# Patient Record
Sex: Male | Born: 1975 | Hispanic: Yes | Marital: Married | Smoking: Light tobacco smoker
Health system: Southern US, Community
[De-identification: ages and names within clinical notes are randomized; demographics above are authoritative.]

## PROBLEM LIST (undated history)

## (undated) HISTORY — PX: APPENDECTOMY: SHX54

---

## 2015-11-03 ENCOUNTER — Emergency Department: Payer: Self-pay | Admitting: Anesthesiology

## 2015-11-03 ENCOUNTER — Emergency Department: Payer: Self-pay

## 2015-11-03 ENCOUNTER — Observation Stay
Admission: EM | Admit: 2015-11-03 | Discharge: 2015-11-04 | Disposition: A | Discharge status: Home / Self Care | Payer: Self-pay | Attending: Surgery | Admitting: Surgery

## 2015-11-03 ENCOUNTER — Encounter: Payer: Self-pay | Admitting: Emergency Medicine

## 2015-11-03 ENCOUNTER — Encounter
Admission: EM | Disposition: A | Discharge status: Home / Self Care | Payer: Self-pay | Source: Home / Self Care | Attending: Emergency Medicine

## 2015-11-03 DIAGNOSIS — K3580 Unspecified acute appendicitis: Principal | ICD-10-CM | POA: Diagnosis present

## 2015-11-03 DIAGNOSIS — K219 Gastro-esophageal reflux disease without esophagitis: Secondary | ICD-10-CM | POA: Insufficient documentation

## 2015-11-03 HISTORY — PX: LAPAROSCOPIC APPENDECTOMY: SHX408

## 2015-11-03 LAB — COMPREHENSIVE METABOLIC PANEL
ALBUMIN: 4.3 g/dL (ref 3.5–5.0)
ALT: 37 U/L (ref 17–63)
AST: 34 U/L (ref 15–41)
Alkaline Phosphatase: 126 U/L (ref 38–126)
Anion gap: 11 (ref 5–15)
BUN: 20 mg/dL (ref 6–20)
CHLORIDE: 102 mmol/L (ref 101–111)
CO2: 22 mmol/L (ref 22–32)
Calcium: 9.4 mg/dL (ref 8.9–10.3)
Creatinine, Ser: 0.77 mg/dL (ref 0.61–1.24)
GFR calc Af Amer: >60 mL/min (ref >60)
GFR calc non Af Amer: >60 mL/min (ref >60)
GLUCOSE: 152 mg/dL — AB (ref 65–99)
POTASSIUM: 3.7 mmol/L (ref 3.5–5.1)
Sodium: 135 mmol/L (ref 135–145)
Total Bilirubin: 0.3 mg/dL (ref 0.3–1.2)
Total Protein: 8.2 g/dL — ABNORMAL HIGH (ref 6.5–8.1)

## 2015-11-03 LAB — URINALYSIS COMPLETE WITH MICROSCOPIC (ARMC ONLY)
BILIRUBIN URINE: NEGATIVE
Bacteria, UA: NONE SEEN
Glucose, UA: NEGATIVE mg/dL
HGB URINE DIPSTICK: NEGATIVE
Nitrite: NEGATIVE
PH: 5 (ref 5.0–8.0)
PROTEIN: NEGATIVE mg/dL
Specific Gravity, Urine: 1.026 (ref 1.005–1.030)

## 2015-11-03 LAB — LIPASE, BLOOD: LIPASE: 40 U/L (ref 11–51)

## 2015-11-03 LAB — CBC
HCT: 43.9 % (ref 40.0–52.0)
HCT: 40.6 % (ref 40.0–52.0)
HEMOGLOBIN: 15 g/dL (ref 13.0–18.0)
Hemoglobin: 14 g/dL (ref 13.0–18.0)
MCH: 30.4 pg (ref 26.0–34.0)
MCH: 31 pg (ref 26.0–34.0)
MCHC: 34.2 g/dL (ref 32.0–36.0)
MCHC: 34.5 g/dL (ref 32.0–36.0)
MCV: 88.9 fL (ref 80.0–100.0)
MCV: 89.9 fL (ref 80.0–100.0)
PLATELETS: 344 10*3/uL (ref 150–440)
PLATELETS: 338 10*3/uL (ref 150–440)
RBC: 4.94 MIL/uL (ref 4.40–5.90)
RBC: 4.52 MIL/uL (ref 4.40–5.90)
RDW: 14.2 % (ref 11.5–14.5)
RDW: 14.3 % (ref 11.5–14.5)
WBC: 19.4 10*3/uL — AB (ref 3.8–10.6)
WBC: 22.2 10*3/uL — AB (ref 3.8–10.6)

## 2015-11-03 LAB — CREATININE, SERUM
CREATININE: 0.74 mg/dL (ref 0.61–1.24)
GFR calc non Af Amer: >60 mL/min (ref >60)

## 2015-11-03 SURGERY — APPENDECTOMY, LAPAROSCOPIC
Anesthesia: General

## 2015-11-03 MED ORDER — ONDANSETRON HCL 4 MG/2ML IJ SOLN
INTRAMUSCULAR | Status: DC | PRN
  Administered 2015-11-03: 4 mg via INTRAVENOUS

## 2015-11-03 MED ORDER — LACTATED RINGERS IV SOLN
INTRAVENOUS | Status: DC
  Administered 2015-11-03 – 2015-11-04 (×2): via INTRAVENOUS

## 2015-11-03 MED ORDER — FENTANYL CITRATE (PF) 100 MCG/2ML IJ SOLN
25.0000 ug | INTRAMUSCULAR | Status: AC | PRN
  Administered 2015-11-03 (×6): 25 ug via INTRAVENOUS

## 2015-11-03 MED ORDER — ONDANSETRON HCL 4 MG/2ML IJ SOLN: 4.0000 mg | Freq: Four times a day (QID) | INTRAMUSCULAR | Status: DC | PRN

## 2015-11-03 MED ORDER — IOPAMIDOL (ISOVUE-370) INJECTION 76%
100.0000 mL | Freq: Once | INTRAVENOUS | Status: AC | PRN
  Administered 2015-11-03: 100 mL via INTRAVENOUS

## 2015-11-03 MED ORDER — HYDROCODONE-ACETAMINOPHEN 5-325 MG PO TABS
1.0000 | ORAL_TABLET | ORAL | Status: DC | PRN
  Administered 2015-11-03 – 2015-11-04 (×3): 1 via ORAL
  Filled 2015-11-03 (×3): qty 1

## 2015-11-03 MED ORDER — FENTANYL CITRATE (PF) 100 MCG/2ML IJ SOLN
INTRAMUSCULAR | Status: DC | PRN
  Administered 2015-11-03 (×2): 50 ug via INTRAVENOUS
  Administered 2015-11-03: 100 ug via INTRAVENOUS

## 2015-11-03 MED ORDER — BUPIVACAINE-EPINEPHRINE (PF) 0.25% -1:200000 IJ SOLN
INTRAMUSCULAR | Status: DC | PRN
  Administered 2015-11-03: 30 mL via PERINEURAL

## 2015-11-03 MED ORDER — OXYCODONE-ACETAMINOPHEN 5-325 MG PO TABS
ORAL_TABLET | ORAL | Status: AC
  Filled 2015-11-03: qty 1

## 2015-11-03 MED ORDER — DEXAMETHASONE SODIUM PHOSPHATE 10 MG/ML IJ SOLN
INTRAMUSCULAR | Status: DC | PRN
  Administered 2015-11-03: 10 mg via INTRAVENOUS

## 2015-11-03 MED ORDER — CEFTRIAXONE SODIUM 1 G IJ SOLR
1.0000 g | Freq: Once | INTRAMUSCULAR | Status: AC
  Administered 2015-11-03: 1 g via INTRAVENOUS
  Filled 2015-11-03: qty 10

## 2015-11-03 MED ORDER — FENTANYL CITRATE (PF) 100 MCG/2ML IJ SOLN
INTRAMUSCULAR | Status: AC
  Administered 2015-11-03: 25 ug via INTRAVENOUS
  Filled 2015-11-03: qty 2

## 2015-11-03 MED ORDER — BUPIVACAINE-EPINEPHRINE (PF) 0.25% -1:200000 IJ SOLN
INTRAMUSCULAR | Status: AC
  Filled 2015-11-03: qty 30

## 2015-11-03 MED ORDER — ONDANSETRON HCL 4 MG/2ML IJ SOLN: 4.0000 mg | Freq: Once | INTRAMUSCULAR | Status: DC | PRN

## 2015-11-03 MED ORDER — MORPHINE SULFATE (PF) 2 MG/ML IV SOLN: 2.0000 mg | INTRAVENOUS | Status: DC | PRN

## 2015-11-03 MED ORDER — LIDOCAINE HCL (CARDIAC) 20 MG/ML IV SOLN
INTRAVENOUS | Status: DC | PRN
  Administered 2015-11-03: 30 mg via INTRAVENOUS

## 2015-11-03 MED ORDER — ONDANSETRON HCL 4 MG/2ML IJ SOLN
4.0000 mg | Freq: Once | INTRAMUSCULAR | Status: AC
  Administered 2015-11-03: 4 mg via INTRAVENOUS

## 2015-11-03 MED ORDER — ONDANSETRON HCL 4 MG/2ML IJ SOLN
INTRAMUSCULAR | Status: AC
  Administered 2015-11-03: 4 mg via INTRAVENOUS
  Filled 2015-11-03: qty 2

## 2015-11-03 MED ORDER — KETOROLAC TROMETHAMINE 30 MG/ML IJ SOLN
INTRAMUSCULAR | Status: DC | PRN
  Administered 2015-11-03: 30 mg via INTRAVENOUS

## 2015-11-03 MED ORDER — ONDANSETRON HCL 4 MG PO TABS: 4.0000 mg | ORAL_TABLET | Freq: Four times a day (QID) | ORAL | Status: DC | PRN

## 2015-11-03 MED ORDER — HEPARIN SODIUM (PORCINE) 5000 UNIT/ML IJ SOLN
5000.0000 [IU] | Freq: Three times a day (TID) | INTRAMUSCULAR | Status: DC
  Administered 2015-11-03 – 2015-11-04 (×2): 5000 [IU] via SUBCUTANEOUS
  Filled 2015-11-03 (×2): qty 1

## 2015-11-03 MED ORDER — SODIUM CHLORIDE 0.9 % IV SOLN
Freq: Once | INTRAVENOUS | Status: AC
Start: 2015-11-03 — End: 2015-11-03
  Administered 2015-11-03: 08:00:00 via INTRAVENOUS

## 2015-11-03 MED ORDER — OXYCODONE-ACETAMINOPHEN 5-325 MG PO TABS
1.0000 | ORAL_TABLET | Freq: Once | ORAL | Status: AC
Start: 2015-11-03 — End: 2015-11-03
  Administered 2015-11-03: 1 via ORAL

## 2015-11-03 MED ORDER — ROCURONIUM BROMIDE 100 MG/10ML IV SOLN
INTRAVENOUS | Status: DC | PRN
  Administered 2015-11-03: 5 mg via INTRAVENOUS
  Administered 2015-11-03: 35 mg via INTRAVENOUS

## 2015-11-03 MED ORDER — SUGAMMADEX SODIUM 500 MG/5ML IV SOLN
INTRAVENOUS | Status: DC | PRN
  Administered 2015-11-03: 208.6 mg via INTRAVENOUS

## 2015-11-03 MED ORDER — SUCCINYLCHOLINE CHLORIDE 20 MG/ML IJ SOLN
INTRAMUSCULAR | Status: DC | PRN
  Administered 2015-11-03: 120 mg via INTRAVENOUS

## 2015-11-03 MED ORDER — MORPHINE SULFATE (PF) 4 MG/ML IV SOLN
4.0000 mg | Freq: Once | INTRAVENOUS | Status: AC
  Administered 2015-11-03: 4 mg via INTRAVENOUS
  Filled 2015-11-03: qty 1

## 2015-11-03 MED ORDER — IOPAMIDOL (ISOVUE-300) INJECTION 61%
30.0000 mL | Freq: Once | INTRAVENOUS | Status: AC | PRN
  Administered 2015-11-03: 30 mL via ORAL

## 2015-11-03 MED ORDER — PROPOFOL 10 MG/ML IV BOLUS
INTRAVENOUS | Status: DC | PRN
  Administered 2015-11-03: 150 mg via INTRAVENOUS

## 2015-11-03 MED ORDER — SODIUM CHLORIDE 0.9 % IR SOLN
Status: DC | PRN
  Administered 2015-11-03: 800 mL

## 2015-11-03 SURGICAL SUPPLY — 45 items
ADHESIVE MASTISOL STRL (MISCELLANEOUS) ×2 IMPLANT
APPLIER CLIP ROT 10 11.4 M/L (STAPLE) ×2
BLADE CLIPPER SURG (BLADE) ×2 IMPLANT
BLADE SURG SZ11 CARB STEEL (BLADE) ×2 IMPLANT
CANISTER SUCT 3000ML (MISCELLANEOUS) ×2 IMPLANT
CATH TRAY 16F METER LATEX (MISCELLANEOUS) ×2 IMPLANT
CHLORAPREP W/TINT 26ML (MISCELLANEOUS) ×2 IMPLANT
CLIP APPLIE ROT 10 11.4 M/L (STAPLE) ×1 IMPLANT
CUTTER FLEX LINEAR 45M (STAPLE) ×2 IMPLANT
DECANTER SPIKE VIAL GLASS SM (MISCELLANEOUS) ×2 IMPLANT
DEVICE TROCAR PUNCTURE CLOSURE (ENDOMECHANICALS) ×2 IMPLANT
ELECT REM PT RETURN 9FT ADLT (ELECTROSURGICAL) ×2
ELECTRODE REM PT RTRN 9FT ADLT (ELECTROSURGICAL) ×1 IMPLANT
ENDOPOUCH RETRIEVER 10 (MISCELLANEOUS) ×2 IMPLANT
GAUZE SPONGE NON-WVN 2X2 STRL (MISCELLANEOUS) ×3 IMPLANT
GLOVE BIO SURGEON STRL SZ 6.5 (GLOVE) ×4 IMPLANT
GLOVE BIO SURGEON STRL SZ8 (GLOVE) ×2 IMPLANT
GLOVE BIOGEL PI IND STRL 7.0 (GLOVE) ×2 IMPLANT
GLOVE BIOGEL PI INDICATOR 7.0 (GLOVE) ×2
GOWN STRL REUS W/ TWL LRG LVL3 (GOWN DISPOSABLE) ×3 IMPLANT
GOWN STRL REUS W/TWL LRG LVL3 (GOWN DISPOSABLE) ×3
IRRIGATION STRYKERFLOW (MISCELLANEOUS) ×1 IMPLANT
IRRIGATOR STRYKERFLOW (MISCELLANEOUS) ×2
KIT RM TURNOVER STRD PROC AR (KITS) ×2 IMPLANT
LABEL OR SOLS (LABEL) ×2 IMPLANT
NDL SAFETY 22GX1.5 (NEEDLE) ×2 IMPLANT
NEEDLE VERESS 14GA 120MM (NEEDLE) ×2 IMPLANT
NS IRRIG 500ML POUR BTL (IV SOLUTION) ×2 IMPLANT
PACK LAP CHOLECYSTECTOMY (MISCELLANEOUS) ×2 IMPLANT
RELOAD 45 VASCULAR/THIN (ENDOMECHANICALS) ×4 IMPLANT
RELOAD STAPLE TA45 3.5 REG BLU (ENDOMECHANICALS) ×2 IMPLANT
SCISSORS METZENBAUM CVD 33 (INSTRUMENTS) IMPLANT
SLEEVE ENDOPATH XCEL 5M (ENDOMECHANICALS) ×2 IMPLANT
SOL .9 NS 3000ML IRR  AL (IV SOLUTION) ×1
SOL .9 NS 3000ML IRR UROMATIC (IV SOLUTION) ×1 IMPLANT
SPONGE LAP 18X18 5 PK (GAUZE/BANDAGES/DRESSINGS) ×2 IMPLANT
SPONGE VERSALON 2X2 STRL (MISCELLANEOUS) ×3
STRIP CLOSURE SKIN 1/2X4 (GAUZE/BANDAGES/DRESSINGS) ×2 IMPLANT
SUT MNCRL 4-0 (SUTURE) ×1
SUT MNCRL 4-0 27XMFL (SUTURE) ×1
SUT VICRYL 0 TIES 12 18 (SUTURE) ×2 IMPLANT
SUTURE MNCRL 4-0 27XMF (SUTURE) ×1 IMPLANT
TROCAR XCEL 12X100 BLDLESS (ENDOMECHANICALS) ×2 IMPLANT
TROCAR XCEL NON-BLD 5MMX100MML (ENDOMECHANICALS) ×2 IMPLANT
TUBING INSUFFLATOR HI FLOW (MISCELLANEOUS) ×2 IMPLANT

## 2015-11-03 NOTE — Transfer of Care (Signed)
Immediate Anesthesia Transfer of Care Note  Patient: Leonard Young  Procedure(s) Performed: Procedure(s): APPENDECTOMY LAPAROSCOPIC (N/A)  Patient Location: PACU  Anesthesia Type:General  Level of Consciousness: patient cooperative and lethargic  Airway & Oxygen Therapy: Patient Spontanous Breathing and Patient connected to face mask oxygen  Post-op Assessment: Report given to RN and Post -op Vital signs reviewed and stable  Post vital signs: Reviewed and stable  Last Vitals:  Vitals:   11/03/15 1352 11/03/15 1557  BP: 138/84 140/85  Pulse: 80 93  Resp: 18 19  Temp: 36.7 C 37.4 C    Last Pain:  Vitals:   11/03/15 1557  TempSrc:   PainSc: Asleep         Complications: No apparent anesthesia complications

## 2015-11-03 NOTE — ED Provider Notes (Signed)
[facility] Emergency Department Provider Note        Time seen: ----------------------------------------- 7:32 AM on 11/03/2015 -----------------------------------------    I have reviewed the triage vital signs and the nursing notes.   HISTORY  Chief Complaint Abdominal Pain    HPI Leonard Young is a 40 y.o. male who presents to ER for abdominal pain. Patient states she's had nausea and vomiting has vomited 3 times since the pain began around midnight. Patient reports she's had a history of this before, he was given prescription for pain but nothing else. He is uncertain of the etiology. Patient states pain started after eating. He denies fever or diarrhea.   History reviewed. No pertinent past medical history.  There are no active problems to display for this patient.   History reviewed. No pertinent surgical history.  Allergies Review of patient's allergies indicates no known allergies.  Social History Social History  Substance Use Topics  . Smoking status: Never Smoker  . Smokeless tobacco: Never Used  . Alcohol use 6.0 oz/week    10 Cans of beer per week    Review of Systems Constitutional: Negative for fever. Cardiovascular: Negative for chest pain. Respiratory: Negative for shortness of breath. Gastrointestinal: Positive for abdominal pain, vomiting Genitourinary: Negative for dysuria. Musculoskeletal: Negative for back pain. Skin: Negative for rash. Neurological: Negative for headaches, focal weakness or numbness.  10-point ROS otherwise negative.  ____________________________________________   PHYSICAL EXAM:  VITAL SIGNS: ED Triage Vitals  Enc Vitals Group     BP 11/03/15 0609 (!) 142/76     Pulse Rate 11/03/15 0609 67     Resp 11/03/15 0609 18     Temp 11/03/15 0609 98.8 F (37.1 C)     Temp Source 11/03/15 0609 Oral     SpO2 11/03/15 0609 97 %     Weight 11/03/15 0609 230 lb (104.3 kg)     Height 11/03/15  0609 5\' 6"  (1.676 m)     Head Circumference --      Peak Flow --      Pain Score 11/03/15 0621 9     Pain Loc --      Pain Edu? --      Excl. in GC? --     Constitutional: Alert and oriented. Well appearing and in no distress. Eyes: Conjunctivae are normal. PERRL. Normal extraocular movements. ENT   Head: Normocephalic and atraumatic.   Nose: No congestion/rhinnorhea.   Mouth/Throat: Mucous membranes are moist.   Neck: No stridor. Cardiovascular: Normal rate, regular rhythm. No murmurs, rubs, or gallops. Respiratory: Normal respiratory effort without tachypnea nor retractions. Breath sounds are clear and equal bilaterally. No wheezes/rales/rhonchi. Gastrointestinal: Nonfocal abdominal tenderness, no rebound or guarding. Normal bowel sounds. Musculoskeletal: Nontender with normal range of motion in all extremities. No lower extremity tenderness nor edema. Neurologic:  Normal speech and language. No gross focal neurologic deficits are appreciated.  Skin:  Skin is warm, dry and intact. No rash noted. Psychiatric: Mood and affect are normal. Speech and behavior are normal.  ____________________________________________  ED COURSE:  Pertinent labs & imaging results that were available during my care of the patient were reviewed by me and considered in my medical decision making (see chart for details). Clinical Course  Patient is no distress, I will assess with basic labs, likely imaging for leukocytosis.  Procedures ____________________________________________   LABS (pertinent positives/negatives)  Labs Reviewed  COMPREHENSIVE METABOLIC PANEL - Abnormal; Notable for the following:  Result Value   Glucose, Bld 152 (*)    Total Protein 8.2 (*)    All other components within normal limits  CBC - Abnormal; Notable for the following:    WBC 19.4 (*)    All other components within normal limits  URINALYSIS COMPLETEWITH MICROSCOPIC ([facility] ONLY) - Abnormal; Notable  for the following:    Color, Urine YELLOW (*)    APPearance CLEAR (*)    Ketones, ur TRACE (*)    Leukocytes, UA TRACE (*)    Squamous Epithelial / LPF 0-5 (*)    All other components within normal limits  LIPASE, BLOOD    RADIOLOGY Images were viewed by me  CT the abdomen and pelvis with contrast Reveals appendicitis ____________________________________________  FINAL ASSESSMENT AND PLAN  Appendicitis  Plan: Patient with labs and imaging as dictated above. CT findings confirm appendicitis. I have ordered IV antibiotics for him. I discussed with Dr. Excell Seltzer from radiology will come evaluate the patient in the ER.   Emily Filbert, MD   Note: This dictation was prepared with Dragon dictation. Any transcriptional errors that result from this process are unintentional    Emily Filbert, MD 11/03/15 1020

## 2015-11-03 NOTE — ED Triage Notes (Signed)
Pt c/o mid abdominal pain that is on the navel region. Pt states that he has N/V and has vomited x3 since pain began around midnight. Pain started after eating. Pt is alert and oriented with NAD noted at this time.

## 2015-11-03 NOTE — Op Note (Signed)
laparascopic appendectomy   Carole Binningafael Vera Garcia Date of operation:  11/03/2015  Indications: The patient presented with a history of  abdominal pain. Workup has revealed findings consistent with acute appendicitis.  Pre-operative Diagnosis: Acute appendicitis  Post-operative Diagnosis: Acute suppurative appendicitis nonruptured  Surgeon: Adah Salvageichard E. Excell Seltzerooper, MD, FACS  Anesthesia: General with endotracheal tube  Procedure Details  The patient was seen again in the preop area. The options of surgery versus observation were reviewed with the patient and/or family. The risks of bleeding, infection, recurrence of symptoms, negative laparoscopy, potential for an open procedure, bowel injury, abscess or infection, were all reviewed as well. The patient was taken to Operating Room, identified as Carole BinningRafael Vera Garcia and the procedure verified as laparoscopic appendectomy. A Time Out was held and the above information confirmed.  The patient was placed in the supine position and general anesthesia was induced.  Antibiotic prophylaxis was administered and VT E prophylaxis was in place. A Foley catheter was placed by the nursing staff.   The abdomen was prepped and draped in a sterile fashion. An infraumbilical incision was made. A Veress needle was placed and pneumoperitoneum was obtained. A 5 mm trocar port was placed without difficulty and the abdominal cavity was explored.  Under direct vision a 5 mm suprapubic port was placed and a 13 mm left lateral port was placed all under direct vision.  The appendix was identified and found to be acutely inflamed it was in the retrocecal position  The appendix was carefully dissected. The base of the appendix was dissected out and divided with a standard load Endo GIA. The mesoappendix was divided with a vascular load Endo GIA. 2 loads were required The appendix was passed out through the left lateral port site with the aid of an Endo Catch bag. The right lower  quadrant and pelvis was then irrigated with copious amounts of normal saline which was aspirated. Inspection  failed to identify any additional bleeding and there were no signs of bowel injury. Therefore the left lateral port site was closed under direct vision utilizing an Endo Close technique with 0 Vicryl interrupted sutures, all under direct vision.   Again the right lower quadrant was inspected there was no sign of bleeding or bowel injury therefore pneumoperitoneum was released, all ports were removed and the skin incisions were approximated with subcuticular 4-0 Monocryl. Steri-Strips and Mastisol and sterile dressings were placed.  The patient tolerated the procedure well, there were no complications. The sponge lap and needle count were correct at the end of the procedure.  The patient was taken to the recovery room in stable condition to be admitted for continued care.  Findings: Acute appendicitis nonruptured in a retrocecal position  Estimated Blood Loss: 25 cc                  Specimens: appendix         Complications: None                  Demitrius Crass E. Excell Seltzerooper MD, FACS

## 2015-11-03 NOTE — ED Notes (Signed)
Pt finished CT contrast and Toni in CT notified.

## 2015-11-03 NOTE — ED Notes (Signed)
Pt states that he just came from [place] Friday.

## 2015-11-03 NOTE — H&P (Signed)
Leonard Young is an 40 y.o. male.    Chief Complaint: Right lower quadrant pain  HPI: This patient with one day of abdominal pain he states that it is mostly central but is in the right lower quadrant as well. He is Spanish-speaking and Dr. Hampton Abbot has assisted in obtaining the answers to my questions. A Spanish speaking interpreter will be obtained when he comes to the operating room see below. Patient has had nausea and vomiting normal bowel movement yesterday no fevers or chills he's never had an episode like this before  He has no past medical history no past surgical history he works in [employer] smokes tobacco once in a while does not drink much alcohol no family history  History reviewed. No pertinent past medical history.  History reviewed. No pertinent surgical history.  No family history on file. Social History:  reports that he has never smoked. He has never used smokeless tobacco. He reports that he drinks about 6.0 oz of alcohol per week . He reports that he does not use drugs.  Allergies: No Known Allergies   (Not in a hospital admission)   Review of Systems  Constitutional: Negative for chills and fever.  HENT: Negative.   Eyes: Negative.   Respiratory: Negative.   Cardiovascular: Negative.   Gastrointestinal: Positive for abdominal pain, heartburn, nausea and vomiting. Negative for blood in stool, constipation, diarrhea and melena.  Genitourinary: Negative.   Musculoskeletal: Negative.   Skin: Negative.   Neurological: Negative.   Endo/Heme/Allergies: Negative.   Psychiatric/Behavioral: Negative.      Physical Exam:  BP (!) 141/90   Pulse 84   Temp 98.2 F (36.8 C) (Oral)   Resp 18   Ht _0  (1.676 m)   Wt 230 lb (104.3 kg)   SpO2 100%   BMI 37.12 kg/m   Physical Exam  Constitutional: He is oriented to person, place, and time and well-developed, well-nourished, and in no distress. No distress.  HENT:  Head: Normocephalic and atraumatic.   Eyes: Pupils are equal, round, and reactive to light. Right eye exhibits no discharge. Left eye exhibits no discharge. No scleral icterus.  Neck: Normal range of motion.  Cardiovascular: Normal rate, regular rhythm and normal heart sounds.   Pulmonary/Chest: Effort normal and breath sounds normal. No respiratory distress. He has no wheezes. He has no rales.  Abdominal: Soft. He exhibits no distension. There is tenderness. There is rebound and guarding.  Musculoskeletal: Normal range of motion. He exhibits no edema or tenderness.  Lymphadenopathy:    He has no cervical adenopathy.  Neurological: He is alert and oriented to person, place, and time.  Skin: Skin is warm and dry. No rash noted. He is not diaphoretic. No erythema.  Psychiatric: Mood and affect normal.  Vitals reviewed.       Results for orders placed or performed during the hospital encounter of 11/03/15 (from the past 48 hour(s))  Lipase, blood     Status: None   Collection Time: 11/03/15  6:26 AM  Result Value Ref Range   Lipase 40 11 - 51 U/L  Comprehensive metabolic panel     Status: Abnormal   Collection Time: 11/03/15  6:26 AM  Result Value Ref Range   Sodium 135 135 - 145 mmol/L   Potassium 3.7 3.5 - 5.1 mmol/L   Chloride 102 101 - 111 mmol/L   CO2 22 22 - 32 mmol/L   Glucose, Bld 152 (H) 65 - 99 mg/dL   BUN 20  6 - 20 mg/dL   Creatinine, Ser 0.77 0.61 - 1.24 mg/dL   Calcium 9.4 8.9 - 10.3 mg/dL   Total Protein 8.2 (H) 6.5 - 8.1 g/dL   Albumin 4.3 3.5 - 5.0 g/dL   AST 34 15 - 41 U/L   ALT 37 17 - 63 U/L   Alkaline Phosphatase 126 38 - 126 U/L   Total Bilirubin 0.3 0.3 - 1.2 mg/dL   GFR calc non Af Amer >60 >60 mL/min   GFR calc Af Amer >60 >60 mL/min    Comment: (NOTE) The eGFR has been calculated using the CKD EPI equation. This calculation has not been validated in all clinical situations. eGFR's persistently <60 mL/min signify possible Chronic Kidney Disease.    Anion gap 11 5 - 15  CBC      Status: Abnormal   Collection Time: 11/03/15  6:26 AM  Result Value Ref Range   WBC 19.4 (H) 3.8 - 10.6 K/uL   RBC 4.94 4.40 - 5.90 MIL/uL   Hemoglobin 15.0 13.0 - 18.0 g/dL   HCT 43.9 40.0 - 52.0 %   MCV 88.9 80.0 - 100.0 fL   MCH 30.4 26.0 - 34.0 pg   MCHC 34.2 32.0 - 36.0 g/dL   RDW 14.2 11.5 - 14.5 %   Platelets 344 150 - 440 K/uL  Urinalysis complete, with microscopic     Status: Abnormal   Collection Time: 11/03/15  6:26 AM  Result Value Ref Range   Color, Urine YELLOW (A) YELLOW   APPearance CLEAR (A) CLEAR   Glucose, UA NEGATIVE NEGATIVE mg/dL   Bilirubin Urine NEGATIVE NEGATIVE   Ketones, ur TRACE (A) NEGATIVE mg/dL   Specific Gravity, Urine 1.026 1.005 - 1.030   Hgb urine dipstick NEGATIVE NEGATIVE   pH 5.0 5.0 - 8.0   Protein, ur NEGATIVE NEGATIVE mg/dL   Nitrite NEGATIVE NEGATIVE   Leukocytes, UA TRACE (A) NEGATIVE   RBC / HPF 0-5 0 - 5 RBC/hpf   WBC, UA 0-5 0 - 5 WBC/hpf   Bacteria, UA NONE SEEN NONE SEEN   Squamous Epithelial / LPF 0-5 (A) NONE SEEN   Mucous PRESENT    Ct Abdomen Pelvis W Contrast  Result Date: 11/03/2015 CLINICAL DATA:  Diffuse abdominal pain.  Nausea and vomiting. EXAM: CT ABDOMEN AND PELVIS WITH CONTRAST TECHNIQUE: Multidetector CT imaging of the abdomen and pelvis was performed using the standard protocol following bolus administration of intravenous contrast. CONTRAST:  100 cc Isovue 370 IV. COMPARISON:  None. FINDINGS: Lower chest: Subpleural 0.4 cm right lower lobe solid pulmonary nodule (series 4/image 12). Hepatobiliary: Normal liver size. Hypodense 0.6 cm left liver lobe lesion, too small to characterize, for which no further follow-up is required. No additional liver lesions. Normal gallbladder with no radiopaque cholelithiasis. No biliary ductal dilatation. Pancreas: Normal, with no mass or duct dilation. Spleen: Normal size. No mass. Adrenals/Urinary Tract: Normal adrenals. Normal kidneys with no hydronephrosis and no renal mass. Normal  bladder. Stomach/Bowel: Grossly normal stomach. Normal caliber small bowel with no small bowel wall thickening. Dilated appendix (17 mm diameter). Diffuse appendiceal wall thickening and hyperenhancement and periappendiceal fat stranding, consistent with acute appendicitis. Normal large bowel with no diverticulosis, large bowel wall thickening or pericolonic fat stranding. Vascular/Lymphatic: Normal caliber abdominal aorta. Patent portal, splenic, hepatic and renal veins. No pathologically enlarged lymph nodes in the abdomen or pelvis. Reproductive: Normal size prostate with nonspecific coarse internal prostatic calcifications. Other: No pneumoperitoneum, ascites or focal fluid collection. Musculoskeletal: No  aggressive appearing focal osseous lesions. Mild thoracolumbar spondylosis . IMPRESSION: Acute appendicitis.  No perforation or abscess. These results were called by telephone at the time of interpretation on 11/03/2015 at 10:17 am to Dr. Lenise Arena , who verbally acknowledged these results. Electronically Signed   By: Ilona Sorrel M.D.   On: 11/03/2015 10:19     Assessment/Plan  This patient with acute appendicitis I see no sign of rupture at this point but he requires laparoscopic appendectomy. I discussed with him with Dr. Hampton Abbot the need for laparoscopic appendectomy and I discussed the risks of bleeding infection recurrence negative laparoscopy and an open procedure this is all reviewed with him and will be reviewed again with a qualified hospital provided interpreter when available.  Florene Glen, MD, FACS

## 2015-11-03 NOTE — Anesthesia Postprocedure Evaluation (Signed)
Anesthesia Post Note  Patient: Leonard BinningRafael Vera Young  Procedure(s) Performed: Procedure(s) (LRB): APPENDECTOMY LAPAROSCOPIC (N/A)  Patient location during evaluation: PACU Anesthesia Type: General Level of consciousness: awake and alert and oriented Pain management: pain level controlled Vital Signs Assessment: post-procedure vital signs reviewed and stable Respiratory status: spontaneous breathing, nonlabored ventilation and respiratory function stable Cardiovascular status: blood pressure returned to baseline and stable Postop Assessment: no signs of nausea or vomiting Anesthetic complications: no    Last Vitals:  Vitals:   11/03/15 1612 11/03/15 1627  BP: (!) 148/84 (!) 149/87  Pulse: 97 92  Resp: (!) 21 15  Temp:      Last Pain:  Vitals:   11/03/15 1627  TempSrc:   PainSc: 5                  Danene Montijo

## 2015-11-03 NOTE — Anesthesia Procedure Notes (Signed)
Procedure Name: Intubation Date/Time: 11/03/2015 2:47 PM Performed by: Omer JackWEATHERLY, Keron Neenan Pre-anesthesia Checklist: Patient identified, Patient being monitored, Timeout performed, Emergency Drugs available and Suction available Patient Re-evaluated:Patient Re-evaluated prior to inductionOxygen Delivery Method: Circle system utilized Preoxygenation: Pre-oxygenation with 100% oxygen Intubation Type: IV induction Ventilation: Oral airway inserted - appropriate to patient size and Mask ventilation with difficulty Laryngoscope Size: Mac and 3 Grade View: Grade II Tube type: Oral Tube size: 7.5 mm Number of attempts: 1 Placement Confirmation: ETT inserted through vocal cords under direct vision,  positive ETCO2 and breath sounds checked- equal and bilateral Secured at: 21 cm Tube secured with: Tape Dental Injury: Teeth and Oropharynx as per pre-operative assessment

## 2015-11-03 NOTE — Anesthesia Preprocedure Evaluation (Signed)
Anesthesia Evaluation  Patient identified by MRN, date of birth, ID band Patient awake    Reviewed: Allergy & Precautions, NPO status , Patient's Chart, lab work & pertinent test results  History of Anesthesia Complications Negative for: history of anesthetic complications  Airway Mallampati: III       Dental   Pulmonary neg pulmonary ROS,           Cardiovascular negative cardio ROS       Neuro/Psych negative neurological ROS  negative psych ROS   GI/Hepatic Neg liver ROS, GERD  ,  Endo/Other  negative endocrine ROS  Renal/GU negative Renal ROS     Musculoskeletal   Abdominal   Peds  Hematology negative hematology ROS (+)   Anesthesia Other Findings   Reproductive/Obstetrics                             Anesthesia Physical Anesthesia Plan  ASA: II and emergent  Anesthesia Plan: General   Post-op Pain Management:    Induction: Intravenous  Airway Management Planned: Oral ETT  Additional Equipment:   Intra-op Plan:   Post-operative Plan:   Informed Consent: I have reviewed the patients History and Physical, chart, labs and discussed the procedure including the risks, benefits and alternatives for the proposed anesthesia with the patient or authorized representative who has indicated his/her understanding and acceptance.     Plan Discussed with:   Anesthesia Plan Comments:         Anesthesia Quick Evaluation

## 2015-11-03 NOTE — ED Notes (Signed)
Pt started PO contrast.

## 2015-11-04 ENCOUNTER — Encounter: Payer: Self-pay | Admitting: Surgery

## 2015-11-04 LAB — CBC
HCT: 34.3 % — ABNORMAL LOW (ref 40.0–52.0)
Hemoglobin: 12 g/dL — ABNORMAL LOW (ref 13.0–18.0)
MCH: 31.5 pg (ref 26.0–34.0)
MCHC: 34.9 g/dL (ref 32.0–36.0)
MCV: 90.2 fL (ref 80.0–100.0)
PLATELETS: 338 10*3/uL (ref 150–440)
RBC: 3.81 MIL/uL — ABNORMAL LOW (ref 4.40–5.90)
RDW: 14.3 % (ref 11.5–14.5)
WBC: 19.2 10*3/uL — AB (ref 3.8–10.6)

## 2015-11-04 MED ORDER — HYDROCODONE-ACETAMINOPHEN 5-325 MG PO TABS: 1.0000 | ORAL_TABLET | ORAL | 0 refills | Status: DC | PRN

## 2015-11-04 NOTE — Progress Notes (Signed)
Patient feels well tolerated diet and wants to be discharged.  We'll discharge today on Vicodin when necessary and follow-up next week

## 2015-11-04 NOTE — Discharge Summary (Signed)
Physician Discharge Summary  Patient ID: Leonard BinningRafael Vera Young MRN: [id number] DOB/AGE: 41/06/1975 41 y.o.  Admit date: 11/03/2015 Discharge date: 11/04/2015   Discharge Diagnoses:  Active Problems:   Acute appendicitis   Procedures:Laparoscopic appendectomy  Hospital Course this patient admitted the hospital with a diagnosis of acute appendicitis he was taken the operating room where laparoscopy confirmed that diagnosis and appendectomy was performed he made a non-, K to postoperative recovery was discharged stable condition to follow-up in our office in 10 days he is given Vicodin for pain and instructions to shower.  Consults: None  Disposition: Final discharge disposition not confirmed     Medication List    TAKE these medications   acetaminophen 325 MG tablet Commonly known as:  TYLENOL Take 325 mg by mouth every 6 (six) hours as needed for moderate pain, fever or headache.   HYDROcodone-acetaminophen 5-325 MG tablet Commonly known as:  NORCO/VICODIN Take 1-2 tablets by mouth every 4 (four) hours as needed for moderate pain.      Follow-up Information    Dionne Miloichard Cooper, MD Follow up in 2 week(s).   Specialty:  Surgery Contact information: [address] [place] [id number] [phone]           Lattie Hawichard E Cooper, MD, FACS

## 2015-11-04 NOTE — Progress Notes (Signed)
1 Day Post-Op  Subjective: Status post laparoscopic appendectomy. Patient feels well this morning will advance diet  Objective: Vital signs in last 24 hours: Temp:  [98 F (36.7 C)-99.4 F (37.4 C)] 98.2 F (36.8 C) (09/12 0446) Pulse Rate:  [80-107] 90 (09/12 0446) Resp:  [15-21] 20 (09/12 0446) BP: (111-149)/(62-90) 123/71 (09/12 0446) SpO2:  [93 %-100 %] 96 % (09/12 0446) Last BM Date: 11/02/15  Intake/Output from previous day: 09/11 0701 - 09/12 0700 In: 1943.4 [I.V.:1943.4] Out: 640 [Urine:600; Blood:40] Intake/Output this shift: No intake/output data recorded.  Physical exam:  Abdomen is soft nontender wounds are clean and dressed  Lab Results: CBC   Recent Labs  11/03/15 1752 11/04/15 0422  WBC 22.2* 19.2*  HGB 14.0 12.0*  HCT 40.6 34.3*  PLT 338 338   BMET  Recent Labs  11/03/15 0626 11/03/15 1752  NA 135  --   K 3.7  --   CL 102  --   CO2 22  --   GLUCOSE 152*  --   BUN 20  --   CREATININE 0.77 0.74  CALCIUM 9.4  --    PT/INR No results for input(s): LABPROT, INR in the last 72 hours. ABG No results for input(s): PHART, HCO3 in the last 72 hours.  Invalid input(s): PCO2, PO2  Studies/Results: Ct Abdomen Pelvis W Contrast  Result Date: 11/03/2015 CLINICAL DATA:  Diffuse abdominal pain.  Nausea and vomiting. EXAM: CT ABDOMEN AND PELVIS WITH CONTRAST TECHNIQUE: Multidetector CT imaging of the abdomen and pelvis was performed using the standard protocol following bolus administration of intravenous contrast. CONTRAST:  100 cc Isovue 370 IV. COMPARISON:  None. FINDINGS: Lower chest: Subpleural 0.4 cm right lower lobe solid pulmonary nodule (series 4/image 12). Hepatobiliary: Normal liver size. Hypodense 0.6 cm left liver lobe lesion, too small to characterize, for which no further follow-up is required. No additional liver lesions. Normal gallbladder with no radiopaque cholelithiasis. No biliary ductal dilatation. Pancreas: Normal, with no mass or  duct dilation. Spleen: Normal size. No mass. Adrenals/Urinary Tract: Normal adrenals. Normal kidneys with no hydronephrosis and no renal mass. Normal bladder. Stomach/Bowel: Grossly normal stomach. Normal caliber small bowel with no small bowel wall thickening. Dilated appendix (17 mm diameter). Diffuse appendiceal wall thickening and hyperenhancement and periappendiceal fat stranding, consistent with acute appendicitis. Normal large bowel with no diverticulosis, large bowel wall thickening or pericolonic fat stranding. Vascular/Lymphatic: Normal caliber abdominal aorta. Patent portal, splenic, hepatic and renal veins. No pathologically enlarged lymph nodes in the abdomen or pelvis. Reproductive: Normal size prostate with nonspecific coarse internal prostatic calcifications. Other: No pneumoperitoneum, ascites or focal fluid collection. Musculoskeletal: No aggressive appearing focal osseous lesions. Mild thoracolumbar spondylosis . IMPRESSION: Acute appendicitis.  No perforation or abscess. These results were called by telephone at the time of interpretation on 11/03/2015 at 10:17 am to Dr. Daryel NovemberJONATHAN WILLIAMS , who verbally acknowledged these results. Electronically Signed   By: Delbert PhenixJason A Poff M.D.   On: 11/03/2015 10:19    Anti-infectives: Anti-infectives    Start     Dose/Rate Route Frequency Ordered Stop   11/03/15 1030  cefTRIAXone (ROCEPHIN) 1 g in dextrose 5 % 50 mL IVPB     1 g 100 mL/hr over 30 Minutes Intravenous  Once 11/03/15 1019 11/03/15 1142      Assessment/Plan: s/p Procedure(s): APPENDECTOMY LAPAROSCOPIC   Patient doing very well after laparoscopic appendectomy will likely be able to go home later on this morning will advance diet  Lattie Hawichard E Marquette Blodgett, MD,  FACS  11/04/2015

## 2015-11-04 NOTE — Progress Notes (Signed)
Patient discharged to home. Interpreter used for communication. IV site removed. Prescriptions given.

## 2015-11-04 NOTE — Discharge Instructions (Signed)
Remove dressing in 24 hours. °May shower in 24 hours. °Leave paper strips in place. °Resume all home medications. °Follow-up with Dr. Kenyana Husak in 10 days. °

## 2015-11-05 LAB — SURGICAL PATHOLOGY

## 2015-11-10 ENCOUNTER — Telehealth: Payer: Self-pay

## 2015-11-10 NOTE — Telephone Encounter (Signed)
Patient called stating that he had a Laparoscopic Appendectomy surgery done on 11/03/2015 by Dr. Excell Seltzerooper. He stated that overall he has been feeling better but for the past few days he has been with an extended abdomen, nausea, vomiting and diarrhea. I asked if he has had a fever or drainage from his incision and he stated that he had not. He stated that he had a post-operation appointment until 11/18/2015 with Dr. Tonita CongWoodham but wouldn't be able to wait to be seen until then. He requested to be seen sooner so I changed his appointment to tomorrow 11/11/2015 to be seen by Dr. Orvis BrillLoflin. Patient agreed.

## 2015-11-11 ENCOUNTER — Observation Stay
Admission: AD | Admit: 2015-11-11 | Discharge: 2015-11-13 | Disposition: A | Discharge status: Home / Self Care | Payer: Self-pay | Source: Ambulatory Visit | Attending: Surgery | Admitting: Surgery

## 2015-11-11 ENCOUNTER — Observation Stay: Payer: Self-pay

## 2015-11-11 ENCOUNTER — Encounter: Payer: Self-pay | Admitting: Surgery

## 2015-11-11 ENCOUNTER — Ambulatory Visit (INDEPENDENT_AMBULATORY_CARE_PROVIDER_SITE_OTHER): Payer: Self-pay | Admitting: Surgery

## 2015-11-11 VITALS — BP 125/58 | HR 106 | Temp 101.1°F | Ht 67.0 in | Wt 229.0 lb

## 2015-11-11 DIAGNOSIS — B9620 Unspecified Escherichia coli [E. coli] as the cause of diseases classified elsewhere: Secondary | ICD-10-CM | POA: Insufficient documentation

## 2015-11-11 DIAGNOSIS — K352 Acute appendicitis with generalized peritonitis, without abscess: Secondary | ICD-10-CM

## 2015-11-11 DIAGNOSIS — B954 Other streptococcus as the cause of diseases classified elsewhere: Secondary | ICD-10-CM | POA: Insufficient documentation

## 2015-11-11 DIAGNOSIS — Z1639 Resistance to other specified antimicrobial drug: Secondary | ICD-10-CM | POA: Insufficient documentation

## 2015-11-11 DIAGNOSIS — Y838 Other surgical procedures as the cause of abnormal reaction of the patient, or of later complication, without mention of misadventure at the time of the procedure: Secondary | ICD-10-CM | POA: Insufficient documentation

## 2015-11-11 DIAGNOSIS — IMO0002 Reserved for concepts with insufficient information to code with codable children: Secondary | ICD-10-CM

## 2015-11-11 DIAGNOSIS — R1031 Right lower quadrant pain: Secondary | ICD-10-CM | POA: Diagnosis present

## 2015-11-11 DIAGNOSIS — B999 Unspecified infectious disease: Secondary | ICD-10-CM | POA: Diagnosis present

## 2015-11-11 DIAGNOSIS — Z79899 Other long term (current) drug therapy: Secondary | ICD-10-CM | POA: Insufficient documentation

## 2015-11-11 DIAGNOSIS — L7632 Postprocedural hematoma of skin and subcutaneous tissue following other procedure: Principal | ICD-10-CM | POA: Insufficient documentation

## 2015-11-11 DIAGNOSIS — L0291 Cutaneous abscess, unspecified: Secondary | ICD-10-CM

## 2015-11-11 DIAGNOSIS — T814XXA Infection following a procedure, initial encounter: Secondary | ICD-10-CM | POA: Insufficient documentation

## 2015-11-11 DIAGNOSIS — Z1611 Resistance to penicillins: Secondary | ICD-10-CM | POA: Insufficient documentation

## 2015-11-11 LAB — COMPREHENSIVE METABOLIC PANEL
ALBUMIN: 2.6 g/dL — AB (ref 3.5–5.0)
ALT: 36 U/L (ref 17–63)
AST: 47 U/L — AB (ref 15–41)
Alkaline Phosphatase: 198 U/L — ABNORMAL HIGH (ref 38–126)
Anion gap: 9 (ref 5–15)
BILIRUBIN TOTAL: 2.3 mg/dL — AB (ref 0.3–1.2)
BUN: 13 mg/dL (ref 6–20)
CHLORIDE: 94 mmol/L — AB (ref 101–111)
CO2: 27 mmol/L (ref 22–32)
CREATININE: 0.72 mg/dL (ref 0.61–1.24)
Calcium: 8.1 mg/dL — ABNORMAL LOW (ref 8.9–10.3)
GFR calc Af Amer: >60 mL/min (ref >60)
GLUCOSE: 125 mg/dL — AB (ref 65–99)
POTASSIUM: 2.9 mmol/L — AB (ref 3.5–5.1)
Sodium: 130 mmol/L — ABNORMAL LOW (ref 135–145)
TOTAL PROTEIN: 6.8 g/dL (ref 6.5–8.1)

## 2015-11-11 LAB — CBC WITH DIFFERENTIAL/PLATELET
BASOS ABS: 0.1 10*3/uL (ref 0–0.1)
BASOS PCT: 0 %
Eosinophils Absolute: 0 10*3/uL (ref 0–0.7)
Eosinophils Relative: 0 %
HEMATOCRIT: 27.7 % — AB (ref 40.0–52.0)
Hemoglobin: 9.3 g/dL — ABNORMAL LOW (ref 13.0–18.0)
LYMPHS PCT: 4 %
Lymphs Abs: 1.1 10*3/uL (ref 1.0–3.6)
MCH: 29.9 pg (ref 26.0–34.0)
MCHC: 33.5 g/dL (ref 32.0–36.0)
MCV: 89.3 fL (ref 80.0–100.0)
Monocytes Absolute: 1.7 10*3/uL — ABNORMAL HIGH (ref 0.2–1.0)
Monocytes Relative: 6 %
NEUTROS ABS: 25.4 10*3/uL — AB (ref 1.4–6.5)
NEUTROS PCT: 90 %
Platelets: 428 10*3/uL (ref 150–440)
RBC: 3.1 MIL/uL — AB (ref 4.40–5.90)
RDW: 14 % (ref 11.5–14.5)
WBC: 28.3 10*3/uL — AB (ref 3.8–10.6)

## 2015-11-11 MED ORDER — ONDANSETRON 4 MG PO TBDP: 4.0000 mg | ORAL_TABLET | Freq: Four times a day (QID) | ORAL | Status: DC | PRN

## 2015-11-11 MED ORDER — IOPAMIDOL (ISOVUE-300) INJECTION 61%
100.0000 mL | Freq: Once | INTRAVENOUS | Status: AC | PRN
  Administered 2015-11-11: 100 mL via INTRAVENOUS

## 2015-11-11 MED ORDER — KCL IN DEXTROSE-NACL 20-5-0.45 MEQ/L-%-% IV SOLN
INTRAVENOUS | Status: DC
  Administered 2015-11-11 – 2015-11-13 (×3): via INTRAVENOUS
  Filled 2015-11-11 (×6): qty 1000

## 2015-11-11 MED ORDER — DIPHENHYDRAMINE HCL 12.5 MG/5ML PO ELIX: 12.5000 mg | ORAL_SOLUTION | Freq: Four times a day (QID) | ORAL | Status: DC | PRN

## 2015-11-11 MED ORDER — ONDANSETRON HCL 4 MG/2ML IJ SOLN: 4.0000 mg | Freq: Four times a day (QID) | INTRAMUSCULAR | Status: DC | PRN

## 2015-11-11 MED ORDER — ENOXAPARIN SODIUM 40 MG/0.4ML ~~LOC~~ SOLN: 40.0000 mg | SUBCUTANEOUS | Status: DC

## 2015-11-11 MED ORDER — MORPHINE SULFATE (PF) 4 MG/ML IV SOLN
4.0000 mg | INTRAVENOUS | Status: DC | PRN
Start: 2015-11-11 — End: 2015-11-13

## 2015-11-11 MED ORDER — ACETAMINOPHEN 650 MG RE SUPP: 650.0000 mg | Freq: Four times a day (QID) | RECTAL | Status: DC | PRN

## 2015-11-11 MED ORDER — PIPERACILLIN-TAZOBACTAM 3.375 G IVPB
3.3750 g | Freq: Three times a day (TID) | INTRAVENOUS | Status: DC
  Administered 2015-11-11 – 2015-11-13 (×5): 3.375 g via INTRAVENOUS
  Filled 2015-11-11 (×6): qty 50

## 2015-11-11 MED ORDER — HYDROCODONE-ACETAMINOPHEN 5-325 MG PO TABS
1.0000 | ORAL_TABLET | ORAL | Status: DC | PRN
  Administered 2015-11-12: 1 via ORAL
  Administered 2015-11-13: 2 via ORAL
  Administered 2015-11-13: 1 via ORAL
  Filled 2015-11-11: qty 1
  Filled 2015-11-11: qty 2
  Filled 2015-11-11: qty 1

## 2015-11-11 MED ORDER — ACETAMINOPHEN 325 MG PO TABS
650.0000 mg | ORAL_TABLET | Freq: Four times a day (QID) | ORAL | Status: DC | PRN
  Administered 2015-11-11: 650 mg via ORAL
  Filled 2015-11-11: qty 2

## 2015-11-11 MED ORDER — PIPERACILLIN-TAZOBACTAM 3.375 G IVPB: 3.3750 g | Freq: Three times a day (TID) | INTRAVENOUS | Status: DC

## 2015-11-11 MED ORDER — POTASSIUM CHLORIDE 10 MEQ/100ML IV SOLN
10.0000 meq | INTRAVENOUS | Status: AC
  Administered 2015-11-11 – 2015-11-12 (×3): 10 meq via INTRAVENOUS
  Filled 2015-11-11 (×3): qty 100

## 2015-11-11 MED ORDER — IOPAMIDOL (ISOVUE-300) INJECTION 61%
30.0000 mL | Freq: Once | INTRAVENOUS | Status: AC
  Administered 2015-11-11: 30 mL via ORAL

## 2015-11-11 MED ORDER — DIPHENHYDRAMINE HCL 50 MG/ML IJ SOLN
12.5000 mg | Freq: Four times a day (QID) | INTRAMUSCULAR | Status: DC | PRN
Start: 2015-11-11 — End: 2015-11-13

## 2015-11-11 MED ORDER — MORPHINE SULFATE (PF) 2 MG/ML IV SOLN: 2.0000 mg | INTRAVENOUS | Status: DC | PRN

## 2015-11-11 MED ORDER — LACTATED RINGERS IV SOLN: INTRAVENOUS | Status: DC

## 2015-11-11 MED ORDER — KETOROLAC TROMETHAMINE 30 MG/ML IJ SOLN: 30.0000 mg | Freq: Four times a day (QID) | INTRAMUSCULAR | Status: DC | PRN

## 2015-11-11 MED ORDER — FAMOTIDINE IN NACL 20-0.9 MG/50ML-% IV SOLN
20.0000 mg | Freq: Two times a day (BID) | INTRAVENOUS | Status: DC
  Administered 2015-11-11 – 2015-11-13 (×3): 20 mg via INTRAVENOUS
  Filled 2015-11-11 (×6): qty 50

## 2015-11-11 MED ORDER — ENOXAPARIN SODIUM 40 MG/0.4ML ~~LOC~~ SOLN
40.0000 mg | SUBCUTANEOUS | Status: DC
  Administered 2015-11-11 – 2015-11-12 (×2): 40 mg via SUBCUTANEOUS
  Filled 2015-11-11 (×2): qty 0.4

## 2015-11-11 MED ORDER — KETOROLAC TROMETHAMINE 30 MG/ML IJ SOLN
30.0000 mg | Freq: Four times a day (QID) | INTRAMUSCULAR | Status: DC
  Administered 2015-11-11 – 2015-11-13 (×8): 30 mg via INTRAVENOUS
  Filled 2015-11-11 (×8): qty 1

## 2015-11-11 NOTE — Progress Notes (Signed)
Please see admission H&P for clinic note.

## 2015-11-11 NOTE — Progress Notes (Signed)
Patient seen and chart reviewed. I have discussed the case with Dr. Orvis BrillLoflin. I agree with her assessment at this point that the patient likely has an intra-abdominal infection following his surgery. We will review his lab work put him on antibiotics and obtain a CT scan tonight. I discussed this plan with the patient and his family and they're in agreement

## 2015-11-11 NOTE — Progress Notes (Signed)
Patient will be admitted to [facility] for Right Lower Quadrant Pain. Patient's room # 230, 2C. This information was given to the patient. He stated that he would drive there as soon as they left our [place] Office.

## 2015-11-11 NOTE — Progress Notes (Signed)
Patient seen and examined. Patient has abdominal pain currently on his right side.  CT scan obtained which does show evidence of an intra-abdominal abscess.  Attempted to contact on-call interventionalists about whether or not the abscess is amenable to IR drainage. Call not returned at this time.  Discussed with the patient that he will need to have the abscess drained. We'll place order for IR drainage. Counseled the patient that if it is unable to be completely drained via IR he would likely require an operation for the drainage. Patient voiced understanding and is in agreement with this plan.  Ricarda Frameharles Nadia Torr, MD [facility] General Surgeon [facility]

## 2015-11-11 NOTE — H&P (Signed)
Leonard Young is an 40 y.o. male.   Chief Complaint: abdominal pain HPI: 40 year old male who had a laparoscopic appendectomy for acute appendicitis by Dr. Excell Seltzer on 9/13. The patient is Spanish-speaking and I did discuss with him through my CMA who is also an interpreter. The patient called into the office and stated that he was having increasing abdominal pain on the right side and that was hurting to breathe. Patient states that his appetite has gone and he's been having some fevers and chills for about 3 or 4 days now. Patient states he's also been having nausea and some diarrhea for that time period as well. The patient states that pain has not had any vomiting and he has not taken his temperature and home. Patient denies any dysuria shortness of breath chest pain or dyspnea on exertion.  PMHx: Acute appendicitis  Past Surgical History:  Procedure Laterality Date  . APPENDECTOMY    . LAPAROSCOPIC APPENDECTOMY N/A 11/03/2015   Procedure: APPENDECTOMY LAPAROSCOPIC;  Surgeon: Lattie Haw, MD;  Location: [facility] ORS;  Service: General;  Laterality: N/A;    Family History  Problem Relation Age of Onset  . Diabetes Mother   . Diabetes Paternal Grandfather    Social History:  reports that he has never smoked. He has never used smokeless tobacco. He reports that he drinks about 6.0 oz of alcohol per week . He reports that he does not use drugs.  Allergies: No Known Allergies   (Not in a hospital admission)  No results found for this or any previous visit (from the past 48 hour(s)). No results found.  Review of Systems  Constitutional: Positive for chills, diaphoresis, fever and malaise/fatigue. Negative for weight loss.  HENT: Negative for congestion and sore throat.   Respiratory: Negative for cough, sputum production and shortness of breath.   Cardiovascular: Negative for chest pain, palpitations and leg swelling.  Gastrointestinal: Positive for abdominal pain, diarrhea,  heartburn and nausea. Negative for blood in stool, constipation and vomiting.  Genitourinary: Negative for dysuria, hematuria and urgency.  Musculoskeletal: Negative for back pain and joint pain.  Skin: Negative for itching and rash.  Neurological: Positive for weakness. Negative for dizziness and loss of consciousness.  Psychiatric/Behavioral: Negative for depression. The patient is not nervous/anxious.   All other systems reviewed and are negative.   Blood pressure (!) 125/58, pulse (!) 106, temperature (!) 101.1 F (38.4 C), temperature source Oral, height 5\' 7"  (1.702 m), weight 229 lb (103.9 kg). Physical Exam  Vitals reviewed. Constitutional: He is oriented to person, place, and time. He appears well-developed and well-nourished. He appears distressed.  Mild distress   HENT:  Head: Normocephalic and atraumatic.  Right Ear: External ear normal.  Left Ear: External ear normal.  Nose: Nose normal.  Mouth/Throat: Oropharynx is clear and moist. No oropharyngeal exudate.  Eyes: Conjunctivae and EOM are normal. Pupils are equal, round, and reactive to light. No scleral icterus.  Neck: Neck supple. No tracheal deviation present.  Cardiovascular: Regular rhythm, normal heart sounds and intact distal pulses.  Exam reveals no gallop and no friction rub.   No murmur heard. tachycardia  Respiratory: Effort normal and breath sounds normal. No respiratory distress. He has no wheezes. He has no rales.  GI: Soft. He exhibits distension. There is tenderness. There is guarding. There is no rebound.  Soft, tender in RLQ and RUQ but greater around McBurney's point, incisions c/d/i without any erythema or drainage.    Musculoskeletal: Normal range of motion.  He exhibits no edema, tenderness or deformity.  Neurological: He is alert and oriented to person, place, and time. No cranial nerve deficit.  Skin: Skin is warm and dry. No rash noted. No erythema. No pallor.  Psychiatric: He has a normal mood  and affect. His behavior is normal. Judgment and thought content normal.     Assessment/Plan 40 year old who is 6 days out from a laparoscopic appendectomy for acute appendicitis. The patient is now having increasing right lower quadrant abdominal pain, nausea, diarrhea and is febrile to 101.1 in the office with some tachycardia. I'm concerned that the patient may have a developing abscess. Given this and the symptoms of the patient I will direct admit him to the hospital will get some labs on him and a CT scan. I will also start him on IV antibiotics upon admission. I did discuss with the patient that if he has an abscess cavity that is large enough to need drainage that he may need that as well. The patient expressed understanding and he will go directly to the hospital and be admitted. I also called and discussed this with Dr. Michela PitcherEly who is aware I will see the patient upon his arrival.  Gladis Riffleatherine L Eimy Plaza, MD 11/11/2015, 4:00 PM

## 2015-11-12 ENCOUNTER — Observation Stay: Payer: Self-pay

## 2015-11-12 LAB — SURGICAL PCR SCREEN
MRSA, PCR: NEGATIVE
Staphylococcus aureus: NEGATIVE

## 2015-11-12 LAB — BASIC METABOLIC PANEL
Anion gap: 9 (ref 5–15)
BUN: 17 mg/dL (ref 6–20)
CHLORIDE: 94 mmol/L — AB (ref 101–111)
CO2: 28 mmol/L (ref 22–32)
CREATININE: 0.64 mg/dL (ref 0.61–1.24)
Calcium: 8.1 mg/dL — ABNORMAL LOW (ref 8.9–10.3)
GFR calc Af Amer: >60 mL/min (ref >60)
GLUCOSE: 136 mg/dL — AB (ref 65–99)
POTASSIUM: 3.2 mmol/L — AB (ref 3.5–5.1)
SODIUM: 131 mmol/L — AB (ref 135–145)

## 2015-11-12 LAB — CBC
HEMATOCRIT: 27.6 % — AB (ref 40.0–52.0)
Hemoglobin: 9.5 g/dL — ABNORMAL LOW (ref 13.0–18.0)
MCH: 30.9 pg (ref 26.0–34.0)
MCHC: 34.4 g/dL (ref 32.0–36.0)
MCV: 89.8 fL (ref 80.0–100.0)
PLATELETS: 407 10*3/uL (ref 150–440)
RBC: 3.07 MIL/uL — ABNORMAL LOW (ref 4.40–5.90)
RDW: 14.1 % (ref 11.5–14.5)
WBC: 27.4 10*3/uL — AB (ref 3.8–10.6)

## 2015-11-12 MED ORDER — FENTANYL CITRATE (PF) 100 MCG/2ML IJ SOLN
INTRAMUSCULAR | Status: AC | PRN
  Administered 2015-11-12: 25 ug via INTRAVENOUS

## 2015-11-12 MED ORDER — MIDAZOLAM HCL 5 MG/5ML IJ SOLN
INTRAMUSCULAR | Status: AC
  Filled 2015-11-12: qty 5

## 2015-11-12 MED ORDER — BUPIVACAINE HCL (PF) 0.25 % IJ SOLN
INTRAMUSCULAR | Status: AC
  Filled 2015-11-12: qty 30

## 2015-11-12 MED ORDER — FENTANYL CITRATE (PF) 100 MCG/2ML IJ SOLN
INTRAMUSCULAR | Status: AC
  Filled 2015-11-12: qty 2

## 2015-11-12 NOTE — Progress Notes (Signed)
Pharmacy Note  Post-IR Procedure Consult with low bleed risk. Per guidelines document, ppx lovenox to restart at least 4 h or at next standard dose interval. Will continue current lovenox order with next dose at 1830.

## 2015-11-12 NOTE — Progress Notes (Signed)
Subjective:   He underwent an interventional radiology procedure today with drainage of several 100 cc of whole blood from his abdominal cavity. A drain was placed and he continues to drain. He feels much better. He's had no fever since the procedure is been able tolerate a regular diet.  Vital signs in last 24 hours: Temp:  [98.4 F (36.9 C)-100.6 F (38.1 C)] 99.9 F (37.7 C) (09/20 1222) Pulse Rate:  [88-103] 92 (09/20 1222) Resp:  [18-39] 18 (09/20 1222) BP: (96-123)/(57-74) 123/58 (09/20 1222) SpO2:  [96 %-99 %] 98 % (09/20 1222) Weight:  [103.9 kg (229 lb)] 103.9 kg (229 lb) (09/19 1858) Last BM Date: 11/12/15  Intake/Output from previous day: 09/19 0701 - 09/20 0700 In: 1091 [I.V.:750; IV Piggyback:341] Out: -   Exam:  His abdomen is soft with no significant abdominal tenderness. Most of the drainage appears to be hematoma like old blood.  Lab Results:  CBC  Recent Labs  11/11/15 1917 11/12/15 0413  WBC 28.3* 27.4*  HGB 9.3* 9.5*  HCT 27.7* 27.6*  PLT 428 407   CMP     Component Value Date/Time   NA 131 (L) 11/12/2015 0413   K 3.2 (L) 11/12/2015 0413   CL 94 (L) 11/12/2015 0413   CO2 28 11/12/2015 0413   GLUCOSE 136 (H) 11/12/2015 0413   BUN 17 11/12/2015 0413   CREATININE 0.64 11/12/2015 0413   CALCIUM 8.1 (L) 11/12/2015 0413   PROT 6.8 11/11/2015 1917   ALBUMIN 2.6 (L) 11/11/2015 1917   AST 47 (H) 11/11/2015 1917   ALT 36 11/11/2015 1917   ALKPHOS 198 (H) 11/11/2015 1917   BILITOT 2.3 (H) 11/11/2015 1917   GFRNONAA >60 11/12/2015 0413   GFRAA >60 11/12/2015 0413   PT/INR No results for input(s): LABPROT, INR in the last 72 hours.  Studies/Results: Ct Abdomen Pelvis W Contrast  Result Date: 11/11/2015 CLINICAL DATA:  Laparoscopic appendectomy on 11/03/2015. Patient now having increased abdominal pain on the right, decreased appetite, fevers, and chills for 4 days. Nausea and some diarrhea. EXAM: CT ABDOMEN AND PELVIS WITH CONTRAST TECHNIQUE:  Multidetector CT imaging of the abdomen and pelvis was performed using the standard protocol following bolus administration of intravenous contrast. CONTRAST:  ISOVUE-300 IOPAMIDOL (ISOVUE-300) INJECTION 61% COMPARISON:  11/03/2015 FINDINGS: Lower chest: Atelectasis in the lung bases. Hepatobiliary: Tiny sub cm low-attenuation lesions in the liver are too small to characterize but likely represent small cysts. No change since prior study. Gallbladder and bile ducts are unremarkable. Pancreas: Unremarkable. No pancreatic ductal dilatation or surrounding inflammatory changes. Spleen: Normal in size without focal abnormality. Adrenals/Urinary Tract: Adrenal glands are unremarkable. Kidneys are normal, without renal calculi, focal lesion, or hydronephrosis. Bladder is unremarkable. Stomach/Bowel: Stomach, small bowel, and colon are not abnormally distended and contrast material flows through to the colon suggesting no evidence of obstruction. Distal small bowel demonstrates some mild wall thickening which could indicate reactive inflammation/edema. Vascular/Lymphatic: No significant vascular findings are present. No enlarged abdominal or pelvic lymph nodes. Reproductive: Prostate gland is not enlarged. Prostate calcifications are present. Other: Moderate large fluid collection demonstrated in the pelvis which appears to be loculated in may represent an abscess. The collection measures about 10.3 x 14.2 cm. There is also mesenteric fluid collection in the right lower quadrant which appears to communicate with the pelvic collection. Additional collection inferior to the liver edge is also probably loculated. Small amount of free air in the abdomen is likely postoperative given recent surgery. There is  no extraluminal contrast material seen to suggest a bowel leak. Surgical clips in the right lower quadrant consistent with history of appendectomy. Musculoskeletal: No acute or significant osseous findings. IMPRESSION:  Large loculated fluid collections demonstrated in the pelvis, right lower quadrant, and right infrahepatic region. Changes likely to represent abdominal/pelvic abscesses. Probable reactive small bowel inflammation causing wall thickening. Small amount of free air in the abdomen is likely postoperative. No extraluminal contrast is seen to suggest perforation. Electronically Signed   By: Burman NievesWilliam  Stevens M.D.   On: 11/11/2015 23:09   Ct Image Guided Drainage By Percutaneous Catheter  Result Date: 11/12/2015 INDICATION: History of recent appendectomy with development of pelvic and abdominal fluid collection, request is been made for drainage EXAM: CT GUIDED DRAINAGE OF PELVIC POSTOPERATIVE HEMATOMA MEDICATIONS: The patient is currently admitted to the hospital and receiving intravenous antibiotics. The antibiotics were administered within an appropriate time frame prior to the initiation of the procedure. ANESTHESIA/SEDATION: 25 mcg IV Fentanyl Moderate Sedation Time:  15 minutes The patient was continuously monitored during the procedure by the interventional radiology nurse under my direct supervision. COMPLICATIONS: None immediate. TECHNIQUE: Informed written consent was obtained from the patient after a thorough discussion of the procedural risks, benefits and alternatives. All questions were addressed. Maximal Sterile Barrier Technique was utilized including caps, mask, sterile gowns, sterile gloves, sterile drape, hand hygiene and skin antiseptic. A timeout was performed prior to the initiation of the procedure. PROCEDURE: The low anterior abdominal wall was prepped with Chlorhexidine in a sterile fashion, and a sterile drape was applied covering the operative field. A sterile gown and sterile gloves were used for the procedure. Local anesthesia was provided with 1% Lidocaine. Utilizing CT fluoroscopic guidance, an 18 gauge trocar needle was placed into the fluid collection and an Amplatz wire was coiled  within the collection. Over this, dilatation a 10 [place]French was performed. A 12 French pigtail drainage catheter was then placed without difficulty into the collection. Old blood was withdrawn from the catheter consistent with an postoperative hematoma. Samples were sent for laboratory evaluation to rule out superinfection. The catheter was then sutured into place and attached to a suction grenade. The patient tolerated the procedure well and was returned his room in satisfactory condition. FINDINGS: Large abdominal pelvic fluid collection is noted which extends along the right lateral peritoneal cavity. IMPRESSION: Successful placement of a 12 French drainage catheter within a large peritoneal fluid collection which was shown to be a postoperative hematoma. Electronically Signed   By: Alcide CleverMark  Lukens M.D.   On: 11/12/2015 12:19    Assessment/Plan: We will continue his antibiotics at least another 12 hours and consider discharge tomorrow. His drain will be in place until next week. His plan was discussed with the patient and his family.

## 2015-11-12 NOTE — Procedures (Signed)
CT drainage of postoperative hematoma  Complications:  None  Samples sent  Blood Loss: none  See dictation in canopy pacs

## 2015-11-12 NOTE — Progress Notes (Signed)
Per Dr. Michela PitcherEly okay for patient to be on full liquids and advance as tolerated

## 2015-11-13 LAB — CBC
HEMATOCRIT: 27.4 % — AB (ref 40.0–52.0)
HEMOGLOBIN: 9.4 g/dL — AB (ref 13.0–18.0)
MCH: 30.8 pg (ref 26.0–34.0)
MCHC: 34.1 g/dL (ref 32.0–36.0)
MCV: 90.1 fL (ref 80.0–100.0)
Platelets: 445 10*3/uL — ABNORMAL HIGH (ref 150–440)
RBC: 3.04 MIL/uL — AB (ref 4.40–5.90)
RDW: 14.4 % (ref 11.5–14.5)
WBC: 22.4 10*3/uL — AB (ref 3.8–10.6)

## 2015-11-13 LAB — BASIC METABOLIC PANEL
ANION GAP: 8 (ref 5–15)
BUN: 16 mg/dL (ref 6–20)
CALCIUM: 8 mg/dL — AB (ref 8.9–10.3)
CHLORIDE: 97 mmol/L — AB (ref 101–111)
CO2: 27 mmol/L (ref 22–32)
Creatinine, Ser: 0.69 mg/dL (ref 0.61–1.24)
GFR calc non Af Amer: >60 mL/min (ref >60)
GLUCOSE: 130 mg/dL — AB (ref 65–99)
Potassium: 3.2 mmol/L — ABNORMAL LOW (ref 3.5–5.1)
Sodium: 132 mmol/L — ABNORMAL LOW (ref 135–145)

## 2015-11-13 MED ORDER — CIPROFLOXACIN HCL 500 MG PO TABS: 500.0000 mg | ORAL_TABLET | Freq: Two times a day (BID) | ORAL | 0 refills | Status: DC

## 2015-11-13 MED ORDER — CIPROFLOXACIN HCL 500 MG PO TABS: 500.0000 mg | ORAL_TABLET | Freq: Two times a day (BID) | ORAL | Status: DC

## 2015-11-13 MED ORDER — METRONIDAZOLE 500 MG PO TABS: 500.0000 mg | ORAL_TABLET | Freq: Three times a day (TID) | ORAL | 0 refills | Status: DC

## 2015-11-13 MED ORDER — METRONIDAZOLE 500 MG PO TABS: 500.0000 mg | ORAL_TABLET | Freq: Three times a day (TID) | ORAL | Status: DC

## 2015-11-13 NOTE — Discharge Summary (Signed)
Patient ID: Leonard BinningRafael Vera Garcia MRN: [id number] DOB/AGE: 41/06/1975 41 y.o.  Admit date: 11/11/2015 Discharge date: 11/13/2015  Discharge Diagnoses:  Postop hematoma possible abscess  Procedures Performed: Percutaneous drain placement  Discharged Condition: good  Hospital Course: He was admitted hospital with an elevated white blood cell count fever and abdominal pain following a ruptured appendicitis. CT scan demonstrated a large fluid collections pelvis and right gutter. He was taken to the interventional radiology suite and CT on 11/12/2015 where he underwent percutaneous drainage. The procedure was uncomplicated large amount of fluid was removed. The drain remains in place.  Since his procedures done quite well. He tolerated regular diet. She remained afebrile although his white blood cell count is still elevated at 22,000. He is up ambulating with no complaints. We'll discharge him home today on oral antibiotics and see him back in the office next week for follow-up. Use and care of his drain was outlined to the patient in detail.  Discharge Orders:   Disposition: 01-Home or Self Care  Discharge Medications:  Current Facility-Administered Medications:  .  acetaminophen (TYLENOL) tablet 650 mg, 650 mg, Oral, Q6H PRN, 650 mg at 11/11/15 1906 **OR** acetaminophen (TYLENOL) suppository 650 mg, 650 mg, Rectal, Q6H PRN, Tiney Rougealph Ely III, MD .  ciprofloxacin (CIPRO) tablet 500 mg, 500 mg, Oral, BID, Tiney Rougealph Ely III, MD .  dextrose 5 % and 0.45 % NaCl with KCl 20 mEq/L infusion, , Intravenous, Continuous, Tiney Rougealph Ely III, MD, Last Rate: 75 mL/hr at 11/13/15 0228 .  diphenhydrAMINE (BENADRYL) 12.5 MG/5ML elixir 12.5 mg, 12.5 mg, Oral, Q6H PRN **OR** diphenhydrAMINE (BENADRYL) injection 12.5 mg, 12.5 mg, Intravenous, Q6H PRN, Gladis Riffleatherine L Loflin, MD .  enoxaparin (LOVENOX) injection 40 mg, 40 mg, Subcutaneous, Q24H, Tiney Rougealph Ely III, MD, 40 mg at 11/12/15 1733 .  famotidine (PEPCID) IVPB 20 mg premix, 20  mg, Intravenous, Q12H, Gladis Riffleatherine L Loflin, MD, 20 mg at 11/13/15 1000 .  HYDROcodone-acetaminophen (NORCO/VICODIN) 5-325 MG per tablet 1-2 tablet, 1-2 tablet, Oral, Q4H PRN, Tiney Rougealph Ely III, MD, 1 tablet at 11/13/15 0906 .  ketorolac (TORADOL) 30 MG/ML injection 30 mg, 30 mg, Intravenous, Q6H, 30 mg at 11/13/15 1254 **FOLLOWED BY** [START ON 11/16/2015] ketorolac (TORADOL) 30 MG/ML injection 30 mg, 30 mg, Intravenous, Q6H PRN, Gladis Riffleatherine L Loflin, MD .  metroNIDAZOLE (FLAGYL) tablet 500 mg, 500 mg, Oral, Q8H, Tiney Rougealph Ely III, MD .  morphine 4 MG/ML injection 4 mg, 4 mg, Intravenous, Q2H PRN, Tiney Rougealph Ely III, MD .  ondansetron (ZOFRAN-ODT) disintegrating tablet 4 mg, 4 mg, Oral, Q6H PRN **OR** ondansetron (ZOFRAN) injection 4 mg, 4 mg, Intravenous, Q6H PRN, Gladis Riffleatherine L Loflin, MD  Follwup: Follow-up Information    [facility] .   Contact information: [address] [place] [id number] [phone]          Signed: Tiney RougeRalph Ely III 11/13/2015, 3:54 PM

## 2015-11-13 NOTE — Discharge Instructions (Signed)
Laparoscopic Appendectomy  °Care After  ° ° °These instructions give you information on caring for yourself after your procedure. Your doctor may also give you more specific instructions. Call your doctor if you have any problems or questions after your procedure.  °HOME CARE  °Do not drive while taking pain medicine (narcotics).  °Take medicine (stool softener) if you cannot poop (constipated).  °Change your bandages (dressings) as told by your doctor.  °Keep your wounds clean and dry. Wash the wounds gently with soap and water. Gently pat the wounds dry with a clean towel.  °Do not take baths, swim, or use hot tubs for 10 days, or as told by your doctor.  °Only take medicine as told by your doctor.  °Continue your normal diet as told by your doctor.  °Do not lift more than 10 pounds (4.5 kilograms) or play contact sports for 3 weeks, or as told by your doctor.  °Slowly increase your activity.  °Take deep breaths to avoid a lung infection (pneumonia). °GET HELP RIGHT AWAY IF:  °You have a fever >101 °You have a rash.  °You have trouble breathing or sharp chest pain.  °You have a reaction to the medicine you are taking.  °Your wound is red, puffy (swollen), or painful.  °You have yellowish-white fluid (pus) coming from the wound.  °You have fluid coming from the wound for longer than 1 day.  °You notice a bad smell coming from the wound or bandage.  °Your wound breaks open after stitches (sutures) or staples are removed.  °You have pain in the shoulders or shoulder blades.   °You are short of breath.  °You feel sick to your stomach (nauseous) or throw up (vomit).  °MAKE SURE YOU:  °Understand these instructions.  °Will watch your condition.  °Will get help right away if you are not doing well or get worse. ° °

## 2015-11-13 NOTE — Care Management (Signed)
Patient admitted for abscess post op laparoscopic appendectomy for acute appendicitis.  Patient spanish speaking.  Assessed with Spanish interpreter.  Patient is listed as Self pay.  Patient states that he is unemployed. Patient's brother at bedside who states that he provides patient with transportation.  [facility] [place]  Patient [facility] with spanish [facility] and [facility].  RNCM following for discharge medication needs.

## 2015-11-13 NOTE — Progress Notes (Signed)
Patient discharged to home as ordered.Medical INterpreter at the bedside to translate. Patient instructed to go to Dr. Michela PitcherEly office for prescription of pain medication in the AM when the office opens. Dr. Michela PitcherEly called because patient did not have anymore pain medication at home. Norco 2 tabs given to patient and patient instructed that he will have to stay for 45 minutes before allowed to go home. Patient ride is at the bedside to take patient home. Patient instructed and return demonstrated how to empty JP drain and record output on the sheet to take to follow up visit. Discharge instructions given to patient in spanish. Patient is alert and oriented, ambulates without assistance no acute distress noted.

## 2015-11-17 LAB — AEROBIC/ANAEROBIC CULTURE W GRAM STAIN (SURGICAL/DEEP WOUND)

## 2015-11-17 LAB — AEROBIC/ANAEROBIC CULTURE (SURGICAL/DEEP WOUND)

## 2015-11-18 ENCOUNTER — Ambulatory Visit: Payer: Self-pay | Admitting: General Surgery

## 2015-11-24 ENCOUNTER — Other Ambulatory Visit
Admission: RE | Admit: 2015-11-24 | Discharge: 2015-11-24 | Disposition: A | Discharge status: Home / Self Care | Payer: Self-pay | Source: Ambulatory Visit | Attending: Surgery | Admitting: Surgery

## 2015-11-24 ENCOUNTER — Telehealth: Payer: Self-pay

## 2015-11-24 ENCOUNTER — Ambulatory Visit (INDEPENDENT_AMBULATORY_CARE_PROVIDER_SITE_OTHER): Payer: Self-pay | Admitting: Surgery

## 2015-11-24 ENCOUNTER — Encounter: Payer: Self-pay | Admitting: Surgery

## 2015-11-24 ENCOUNTER — Ambulatory Visit
Admission: RE | Admit: 2015-11-24 | Discharge: 2015-11-24 | Disposition: A | Discharge status: Home / Self Care | Payer: Self-pay | Source: Ambulatory Visit | Attending: Surgery | Admitting: Surgery

## 2015-11-24 VITALS — BP 128/79 | HR 94 | Temp 98.0°F | Ht 67.0 in | Wt 219.0 lb

## 2015-11-24 DIAGNOSIS — K750 Abscess of liver: Secondary | ICD-10-CM | POA: Insufficient documentation

## 2015-11-24 DIAGNOSIS — R1031 Right lower quadrant pain: Secondary | ICD-10-CM

## 2015-11-24 DIAGNOSIS — K352 Acute appendicitis with generalized peritonitis, without abscess: Secondary | ICD-10-CM

## 2015-11-24 DIAGNOSIS — L729 Follicular cyst of the skin and subcutaneous tissue, unspecified: Secondary | ICD-10-CM

## 2015-11-24 DIAGNOSIS — G8929 Other chronic pain: Secondary | ICD-10-CM | POA: Insufficient documentation

## 2015-11-24 LAB — BASIC METABOLIC PANEL
Anion gap: 8 (ref 5–15)
BUN: 10 mg/dL (ref 6–20)
CALCIUM: 8.3 mg/dL — AB (ref 8.9–10.3)
CO2: 26 mmol/L (ref 22–32)
CREATININE: 0.6 mg/dL — AB (ref 0.61–1.24)
Chloride: 99 mmol/L — ABNORMAL LOW (ref 101–111)
GFR calc non Af Amer: >60 mL/min (ref >60)
Glucose, Bld: 107 mg/dL — ABNORMAL HIGH (ref 65–99)
Potassium: 3.5 mmol/L (ref 3.5–5.1)
Sodium: 133 mmol/L — ABNORMAL LOW (ref 135–145)

## 2015-11-24 LAB — CBC WITH DIFFERENTIAL/PLATELET
BASOS PCT: 1 %
Basophils Absolute: 0.1 10*3/uL (ref 0–0.1)
EOS ABS: 0.1 10*3/uL (ref 0–0.7)
Eosinophils Relative: 1 %
HEMATOCRIT: 29.9 % — AB (ref 40.0–52.0)
Hemoglobin: 9.9 g/dL — ABNORMAL LOW (ref 13.0–18.0)
Lymphocytes Relative: 9 %
Lymphs Abs: 1.5 10*3/uL (ref 1.0–3.6)
MCH: 29 pg (ref 26.0–34.0)
MCHC: 33.1 g/dL (ref 32.0–36.0)
MCV: 87.8 fL (ref 80.0–100.0)
MONO ABS: 1.2 10*3/uL — AB (ref 0.2–1.0)
MONOS PCT: 7 %
Neutro Abs: 14.2 10*3/uL — ABNORMAL HIGH (ref 1.4–6.5)
Neutrophils Relative %: 82 %
Platelets: 843 10*3/uL — ABNORMAL HIGH (ref 150–440)
RBC: 3.4 MIL/uL — ABNORMAL LOW (ref 4.40–5.90)
RDW: 15.1 % — AB (ref 11.5–14.5)
WBC: 17 10*3/uL — ABNORMAL HIGH (ref 3.8–10.6)

## 2015-11-24 MED ORDER — CEPHALEXIN 500 MG PO CAPS: 500.0000 mg | ORAL_CAPSULE | Freq: Four times a day (QID) | ORAL | 0 refills | Status: DC

## 2015-11-24 MED ORDER — IOPAMIDOL (ISOVUE-300) INJECTION 61%
100.0000 mL | Freq: Once | INTRAVENOUS | Status: AC | PRN
Start: 2015-11-24 — End: 2015-11-24
  Administered 2015-11-24: 100 mL via INTRAVENOUS

## 2015-11-24 MED ORDER — METRONIDAZOLE 500 MG PO TABS: 500.0000 mg | ORAL_TABLET | Freq: Three times a day (TID) | ORAL | 0 refills | Status: DC

## 2015-11-24 NOTE — Addendum Note (Signed)
Addended by: Cameron ProudHILDERS, WINDELLA S on: 11/24/2015 04:20 PM   Modules accepted: Orders

## 2015-11-24 NOTE — Progress Notes (Signed)
Outpatient postop visit  11/24/2015  Carole BinningRafael Vera Garcia is an 40 y.o. male.    Procedure: Laparoscopic appendectomy  CC: None  HPI: This patient underwent a laparoscopic appendectomy and then came back to the hospital with fevers and a CT scan suggestive of a pelvic abscess. He had a CT-guided drainage performed and has been at home taking oral antibiotics Cipro and Flagyl at this point. Currently he is feeling well has no fevers or chills and is eating well.  Medications reviewed.    Physical Exam:  BP 128/79   Pulse 94   Temp 98 F (36.7 C) (Oral)   Ht 5\' 7"  (1.702 m)   Wt 219 lb (99.3 kg)   BMI 34.30 kg/m     PE: He is afebrile. The drain still has some purulence in it but it is very minimal in volume. There is no erythema or drainage and his abdomen is soft and nontender. Wounds are healing well.    Assessment/Plan:  Previous CT scan is reviewed. Currently the patient is doing very well but I am reluctant to remove his drain as there is purulence within the drain and I would like to repeat his CT scan and CBC at this point. This is discussed with him via an interpreter and we'll schedule CT scan CBC and refill his medications. Of note however the organism was resistant to Cipro therefore I will switch him to Keflex and Flagyl. I will see him back after the CT scan to be performed this week.  Lattie Hawichard E Deryk Bozman, MD, FACS

## 2015-11-24 NOTE — Telephone Encounter (Signed)
Spoke with Dr. Excell Seltzerooper regarding results to CT Scan and Labwork completed today. He would like to see patient in am, have him be NPO after MN, and plans on direct admit for 1-2 days from hospital.  Spoke with patient's brother at this time and I explained results and situation above. He agrees to bring patient to office at 0900am tomorrow morning and remain NPO after MN.

## 2015-11-24 NOTE — Patient Instructions (Addendum)
We have scheduled you for a CT Scan of your Abdomen and Pelvis. Please go to [facility] at this time to have your scan completed. We need you to go to the lab at [facility] today.   Do not eat or drink anything until you have your scan.

## 2015-11-25 ENCOUNTER — Inpatient Hospital Stay: Payer: Self-pay

## 2015-11-25 ENCOUNTER — Inpatient Hospital Stay
Admission: AD | Admit: 2015-11-25 | Discharge: 2015-11-27 | DRG: 862 | Disposition: A | Discharge status: Home / Self Care | Payer: Self-pay | Source: Ambulatory Visit | Attending: Surgery | Admitting: Surgery

## 2015-11-25 ENCOUNTER — Ambulatory Visit (INDEPENDENT_AMBULATORY_CARE_PROVIDER_SITE_OTHER): Payer: Self-pay | Admitting: Surgery

## 2015-11-25 ENCOUNTER — Encounter: Payer: Self-pay | Admitting: Surgery

## 2015-11-25 ENCOUNTER — Encounter: Payer: Self-pay | Admitting: *Deleted

## 2015-11-25 VITALS — BP 121/67 | HR 84 | Temp 98.0°F | Ht 67.0 in | Wt 216.5 lb

## 2015-11-25 DIAGNOSIS — K750 Abscess of liver: Secondary | ICD-10-CM

## 2015-11-25 DIAGNOSIS — K3580 Unspecified acute appendicitis: Secondary | ICD-10-CM | POA: Diagnosis present

## 2015-11-25 DIAGNOSIS — Y836 Removal of other organ (partial) (total) as the cause of abnormal reaction of the patient, or of later complication, without mention of misadventure at the time of the procedure: Secondary | ICD-10-CM | POA: Diagnosis present

## 2015-11-25 DIAGNOSIS — T814XXA Infection following a procedure, initial encounter: Principal | ICD-10-CM | POA: Diagnosis present

## 2015-11-25 LAB — BASIC METABOLIC PANEL
ANION GAP: 7 (ref 5–15)
BUN: 11 mg/dL (ref 6–20)
CALCIUM: 8.5 mg/dL — AB (ref 8.9–10.3)
CO2: 27 mmol/L (ref 22–32)
Chloride: 102 mmol/L (ref 101–111)
Creatinine, Ser: 0.67 mg/dL (ref 0.61–1.24)
GLUCOSE: 110 mg/dL — AB (ref 65–99)
POTASSIUM: 3.8 mmol/L (ref 3.5–5.1)
Sodium: 136 mmol/L (ref 135–145)

## 2015-11-25 LAB — PROTIME-INR
INR: 1.34
Prothrombin Time: 16.7 seconds — ABNORMAL HIGH (ref 11.4–15.2)

## 2015-11-25 MED ORDER — ONDANSETRON HCL 4 MG/2ML IJ SOLN: 4.0000 mg | Freq: Four times a day (QID) | INTRAMUSCULAR | Status: DC | PRN

## 2015-11-25 MED ORDER — FENTANYL CITRATE (PF) 100 MCG/2ML IJ SOLN
INTRAMUSCULAR | Status: AC
  Filled 2015-11-25: qty 4

## 2015-11-25 MED ORDER — HEPARIN SODIUM (PORCINE) 5000 UNIT/ML IJ SOLN
5000.0000 [IU] | Freq: Three times a day (TID) | INTRAMUSCULAR | Status: DC
  Administered 2015-11-25 – 2015-11-27 (×5): 5000 [IU] via SUBCUTANEOUS
  Filled 2015-11-25 (×5): qty 1

## 2015-11-25 MED ORDER — MIDAZOLAM HCL 5 MG/5ML IJ SOLN
INTRAMUSCULAR | Status: AC
  Filled 2015-11-25: qty 5

## 2015-11-25 MED ORDER — DEXTROSE IN LACTATED RINGERS 5 % IV SOLN
INTRAVENOUS | Status: DC
  Administered 2015-11-25 – 2015-11-26 (×5): via INTRAVENOUS

## 2015-11-25 MED ORDER — MORPHINE SULFATE (PF) 2 MG/ML IV SOLN: 2.0000 mg | INTRAVENOUS | Status: DC | PRN

## 2015-11-25 MED ORDER — OXYCODONE-ACETAMINOPHEN 5-325 MG PO TABS
1.0000 | ORAL_TABLET | ORAL | Status: DC | PRN
  Administered 2015-11-25: 2 via ORAL
  Filled 2015-11-25: qty 2

## 2015-11-25 MED ORDER — INFLUENZA VAC SPLIT QUAD 0.5 ML IM SUSY
0.5000 mL | PREFILLED_SYRINGE | INTRAMUSCULAR | Status: AC
  Administered 2015-11-26: 0.5 mL via INTRAMUSCULAR
  Filled 2015-11-25: qty 0.5

## 2015-11-25 MED ORDER — FENTANYL CITRATE (PF) 100 MCG/2ML IJ SOLN
INTRAMUSCULAR | Status: AC | PRN
  Administered 2015-11-25: 50 ug via INTRAVENOUS

## 2015-11-25 MED ORDER — CEFAZOLIN IN D5W 1 GM/50ML IV SOLN
1.0000 g | Freq: Four times a day (QID) | INTRAVENOUS | Status: DC
  Administered 2015-11-25 – 2015-11-27 (×8): 1 g via INTRAVENOUS
  Filled 2015-11-25 (×11): qty 50

## 2015-11-25 MED ORDER — ONDANSETRON HCL 4 MG PO TABS: 4.0000 mg | ORAL_TABLET | Freq: Four times a day (QID) | ORAL | Status: DC | PRN

## 2015-11-25 NOTE — Patient Instructions (Signed)
We need for you to report to the registration desk at [facility]. Please do not eat or drink anything. Please call our office if you have questions or concerns.

## 2015-11-25 NOTE — Procedures (Signed)
Ct guided righ t mid abdominal abscess drain placement  Complications:  None  Blood Loss: none  See dictation in canopy pacs

## 2015-11-25 NOTE — Progress Notes (Signed)
Leonard Young is an 40 y.o. male.    Chief Complaint: Liver abscess  HPI: This a patient status post laparoscopic appendectomy who developed a pelvic abscess and has a drain in place. The drain was putting out very minimal but purulent drainage and a CT scan was obtained yesterday to determine if the pelvic cavity was adequately drained. The results of the CT scan suggested that the pelvic cavity had collapsed but that there were 2 liver abscesses present 1 measuring 3 cm and one over 10 cm. I spoken to the reading radiologist yesterday as well as interventional radiologist and they suggested that the larger abscess could easily be drained with either ultrasound or CT-guided drainage. I asked the patient and his family member acting as an interpreter to return today. He has been nothing by mouth and will be admitted to the hospital with orders for CT-guided drainage which could be changed ultrasound at the discretion of the performing radiologist.  Patient feels well denies fevers or chills at this time.  History reviewed. No pertinent past medical history.  Past Surgical History:  Procedure Laterality Date  . APPENDECTOMY    . LAPAROSCOPIC APPENDECTOMY N/A 11/03/2015   Procedure: APPENDECTOMY LAPAROSCOPIC;  Surgeon: Florene Glen, MD;  Location: [facility] ORS;  Service: General;  Laterality: N/A;    Family History  Problem Relation Age of Onset  . Diabetes Mother   . Diabetes Paternal Grandfather    Social History:  reports that he has never smoked. He has never used smokeless tobacco. He reports that he drinks alcohol. He reports that he does not use drugs.  Allergies: No Known Allergies   (Not in a hospital admission)   Review of Systems  Constitutional: Negative for chills, fever and weight loss.  HENT: Negative.   Eyes: Negative.   Respiratory: Negative.   Cardiovascular: Negative.   Gastrointestinal: Negative.   Genitourinary: Negative.   Musculoskeletal: Negative.    Skin: Negative.   Neurological: Negative.   Endo/Heme/Allergies: Negative.   Psychiatric/Behavioral: Negative.      Physical Exam:  BP 121/67   Pulse 84   Temp 98 F (36.7 C) (Oral)   Ht 5' 7"  (1.702 m)   Wt 216 lb 8 oz (98.2 kg)   BMI 33.91 kg/m   Physical Exam  Constitutional: He is oriented to person, place, and time and well-developed, well-nourished, and in no distress. No distress.  HENT:  Head: Normocephalic and atraumatic.  Eyes: Pupils are equal, round, and reactive to light. Right eye exhibits no discharge. Left eye exhibits no discharge. No scleral icterus.  Neck: Normal range of motion.  Cardiovascular: Normal rate, regular rhythm and normal heart sounds.   Pulmonary/Chest: Effort normal and breath sounds normal. No respiratory distress. He has no wheezes. He has no rales.  Abdominal: Soft. He exhibits no distension. There is no tenderness. There is no rebound and no guarding.  Drain in place with minimal purulence drainage 30 cc since yesterday  Musculoskeletal: Normal range of motion. He exhibits no edema, tenderness or deformity.  Lymphadenopathy:    He has no cervical adenopathy.  Neurological: He is alert and oriented to person, place, and time.  Skin: Skin is warm and dry. No rash noted. He is not diaphoretic. No erythema.  Vitals reviewed.       Results for orders placed or performed during the hospital encounter of 11/24/15 (from the past 48 hour(s))  CBC with Differential/Platelet     Status: Abnormal   Collection Time:  11/24/15 11:43 AM  Result Value Ref Range   WBC 17.0 (H) 3.8 - 10.6 K/uL   RBC 3.40 (L) 4.40 - 5.90 MIL/uL   Hemoglobin 9.9 (L) 13.0 - 18.0 g/dL   HCT 29.9 (L) 40.0 - 52.0 %   MCV 87.8 80.0 - 100.0 fL   MCH 29.0 26.0 - 34.0 pg   MCHC 33.1 32.0 - 36.0 g/dL   RDW 15.1 (H) 11.5 - 14.5 %   Platelets 843 (H) 150 - 440 K/uL   Neutrophils Relative % 82 %   Neutro Abs 14.2 (H) 1.4 - 6.5 K/uL   Lymphocytes Relative 9 %   Lymphs Abs  1.5 1.0 - 3.6 K/uL   Monocytes Relative 7 %   Monocytes Absolute 1.2 (H) 0.2 - 1.0 K/uL   Eosinophils Relative 1 %   Eosinophils Absolute 0.1 0 - 0.7 K/uL   Basophils Relative 1 %   Basophils Absolute 0.1 0 - 0.1 K/uL  Basic metabolic panel     Status: Abnormal   Collection Time: 11/24/15  2:01 PM  Result Value Ref Range   Sodium 133 (L) 135 - 145 mmol/L   Potassium 3.5 3.5 - 5.1 mmol/L   Chloride 99 (L) 101 - 111 mmol/L   CO2 26 22 - 32 mmol/L   Glucose, Bld 107 (H) 65 - 99 mg/dL   BUN 10 6 - 20 mg/dL   Creatinine, Ser 0.60 (L) 0.61 - 1.24 mg/dL   Calcium 8.3 (L) 8.9 - 10.3 mg/dL   GFR calc non Af Amer >60 >60 mL/min   GFR calc Af Amer >60 >60 mL/min    Comment: (NOTE) The eGFR has been calculated using the CKD EPI equation. This calculation has not been validated in all clinical situations. eGFR's persistently <60 mL/min signify possible Chronic Kidney Disease.    Anion gap 8 5 - 15   Ct Abdomen Pelvis W Contrast  Result Date: 11/24/2015 CLINICAL DATA:  Appendectomy 2 weeks ago. Secondary abscess with drain. EXAM: CT ABDOMEN AND PELVIS WITH CONTRAST TECHNIQUE: Multidetector CT imaging of the abdomen and pelvis was performed using the standard protocol following bolus administration of intravenous contrast. CONTRAST:  114m ISOVUE-300 IOPAMIDOL (ISOVUE-300) INJECTION 61% COMPARISON:  11/11/2015.  11/12/2015. FINDINGS: Lower chest: There is a freely layering right effusion with atelectasis in the right lower lobe. Hepatobiliary: Development of 2 subcapsular liver abscesses along the posterior margin of the right lobe, more cephalad abscess measures 4.3 x 3.7 x 3.3 cm. Larger abscess at the caudal tip measures 6.5 by 9.9 x 5.7 cm. This lower abscess is in direct communication with the lateral wall of the ascending colon/ hepatic flexure in there appears to be inflammation of the colon in that segment. No fistula formation at this time, but that would be a concern. Pancreas: Normal  Spleen: Normal Adrenals/Urinary Tract: Normal Stomach/Bowel: Involvement of the right colon as discussed above. Surgical clips related to previous appendectomy. Mild inflammatory stranding remains evident in that region. There has been successful drainage of the majority of the large lower abdominal abscess in which the drain was placed. There does not appear to be any measurable on drained component. There is a small amount of air within the collapsed cavity, not unexpected given the presence of the drainage catheter. Vascular/Lymphatic: Aorta and IVC are normal. Reproductive: Negative Other: None significant Musculoskeletal: Negative IMPRESSION: Catheter drainage of the large pelvic abscess has been quite successful, with near complete resolution. I suspect that there still may be a small  amount of drainable fluid in the region. Development of 2 subcapsular liver abscesses along the inferior margin of the right lobe. The larger, more caudal liver abscess is in immediate contact with the lateral margin of the ascending colon/ hepatic flexure, and there appears to be inflammation of the colon in that location. Direct fistula is not demonstrated but is a concern in the future. I am in the process of calling this report to Dr. Burt Knack. Electronically Signed   By: Nelson Chimes M.D.   On: 11/24/2015 14:17     Assessment/Plan  This a patient with a liver abscess post appendicitis. While he is minimally symptomatic if at all, he requires admission the hospital and IV antibiotics as well as drainage of this abscess. I've spoken to interventional radiology yesterday who agreed that this was amenable to drainage and to be done today. He is to be admitted to the hospital and remain nothing by mouth he has not had breakfast this morning and INR will be obtained this morning and orders of been placed for admission as well as for CT guided drainage which could be changed ultrasound guided drainage at the discretion of the  interventional radiologist. He will be admitted to hospital and kept on IV antibiotics for a day or 2. I will discuss this patient's care with Dr. Azalee Course who is the hospital rounding physician.  Florene Glen, MD, FACS

## 2015-11-25 NOTE — Progress Notes (Signed)
Okay per Dr. Orvis BrillLoflin to place order for regular diet

## 2015-11-26 NOTE — Progress Notes (Signed)
11/26/2015  Subjective: No acute events overnight.  Patient denies any abdominal pain and drains have been working well.  Cultures currently pending.  Vital signs: Temp:  [97.6 F (36.4 C)] 97.6 F (36.4 C) (10/04 0733) Pulse Rate:  [71-73] 71 (10/04 0733) Resp:  [19] 19 (10/04 0733) BP: (108-111)/(61-71) 111/71 (10/04 0733) SpO2:  [97 %-98 %] 97 % (10/04 0733) Weight:  [97.3 kg (214 lb 6.4 oz)] 97.3 kg (214 lb 6.4 oz) (10/04 1359)   Intake/Output: 10/03 0701 - 10/04 0700 In: 2347 [P.O.:240; I.V.:2032; IV Piggyback:50] Out: 1767.5 [Urine:1675; Drains:92.5] Last BM Date: 11/26/15  Physical Exam: Constitutional:  No acute distress Cardiac:  Regular rhythm and rate Pulm: no respiratory distress, lungs clear bilaterally Abdomen:  Soft, nondistended, nontender.  RUQ and RLQ percutaneous drains in place with minimal seropurulent fluid  Labs:   Recent Labs  11/24/15 1143  WBC 17.0*  HGB 9.9*  HCT 29.9*  PLT 843*    Recent Labs  11/24/15 1401 11/25/15 1028  NA 133* 136  K 3.5 3.8  CL 99* 102  CO2 26 27  GLUCOSE 107* 110*  BUN 10 11  CREATININE 0.60* 0.67  CALCIUM 8.3* 8.5*    Recent Labs  11/25/15 1028  LABPROT 16.7*  INR 1.34    Imaging: No results found.  Assessment/Plan: 40 yo male s/p lap appendectomy with now post-op liver abscess, s/p perc drainage. -Awaiting abscess cultures -Will change to po abx when cultures resulted and possible d/c home patient tomorrow. -Otherwise continue ambulating, regular diet.   Howie IllJose Luis Coburn Knaus, MD [facility]

## 2015-11-26 NOTE — Care Management (Signed)
Patient admitted with liver abscess post appendicitis.  [facility] Patient I snot employed, brother provides transportation.  [facility] [place]  Previous last admission was provided with spanish application [facility], and medication management.  RNCM following for discharge needs.

## 2015-11-27 LAB — CBC WITH DIFFERENTIAL/PLATELET
BASOS ABS: 0.1 10*3/uL (ref 0–0.1)
Basophils Relative: 1 %
EOS PCT: 3 %
Eosinophils Absolute: 0.3 10*3/uL (ref 0–0.7)
HCT: 29.7 % — ABNORMAL LOW (ref 40.0–52.0)
Hemoglobin: 9.9 g/dL — ABNORMAL LOW (ref 13.0–18.0)
LYMPHS PCT: 19 %
Lymphs Abs: 1.7 10*3/uL (ref 1.0–3.6)
MCH: 29.9 pg (ref 26.0–34.0)
MCHC: 33.4 g/dL (ref 32.0–36.0)
MCV: 89.4 fL (ref 80.0–100.0)
MONO ABS: 0.5 10*3/uL (ref 0.2–1.0)
MONOS PCT: 6 %
Neutro Abs: 6.3 10*3/uL (ref 1.4–6.5)
Neutrophils Relative %: 71 %
Platelets: 648 10*3/uL — ABNORMAL HIGH (ref 150–440)
RBC: 3.32 MIL/uL — ABNORMAL LOW (ref 4.40–5.90)
RDW: 14.9 % — AB (ref 11.5–14.5)
WBC: 8.8 10*3/uL (ref 3.8–10.6)

## 2015-11-27 MED ORDER — CEPHALEXIN 500 MG PO CAPS: 500.0000 mg | ORAL_CAPSULE | Freq: Four times a day (QID) | ORAL | 0 refills | Status: DC

## 2015-11-27 MED ORDER — METRONIDAZOLE 500 MG PO TABS: 500.0000 mg | ORAL_TABLET | Freq: Three times a day (TID) | ORAL | 0 refills | Status: DC

## 2015-11-27 NOTE — Care Management (Signed)
Patient to discharge home today.  Patient to discharge with 2 antibiotics.  Patient provided with coupons from [email or web address].  Total out of pocket price $24.  Patient confirms that he will be able to afford these medications. Patient already has application for Medication Management and [facility].  No further RNCM needs identified.  Signing off.

## 2015-11-27 NOTE — Discharge Summary (Signed)
Physician Discharge Summary  Patient ID: Carole BinningRafael Vera Garcia MRN: [id number] DOB/AGE: 41/06/1975 41 y.o.  Admit date: 11/25/2015 Discharge date: 11/27/2015  Admission Diagnoses: Acute appendicitis; Liver abscess  Discharge Diagnoses:  Active Problems:   Bacterial liver abscess   Discharged Condition: good  Hospital Course: 40 yr old male with appendicitis with drain from abscess who returned to clinic with a liver abscess. He had a drain placed under Ct scan guidance.  His other drain was removed since cavity drained on CT and minimal drainage.  He was eating well and pain resolved.  He has had drain teaching as well.   Consults: None  Significant Diagnostic Studies: CT  Treatments: antibiotics: Ancef and CT guided drain  Discharge Exam: Blood pressure (!) 109/58, pulse 73, temperature 98 F (36.7 C), temperature source Oral, resp. rate 20, height 5\' 6"  (1.676 m), weight 214 lb 6.4 oz (97.3 kg), SpO2 98 %. General appearance: alert, cooperative and no distress GI: soft, non tender, Upper JP drain with sanginous drainage, inferior removed Extremities: extremities normal, atraumatic, no cyanosis or edema  Disposition: 01-Home or Self Care  Discharge Instructions    Call MD for:  persistant nausea and vomiting    Complete by:  As directed    Call MD for:  redness, tenderness, or signs of infection (pain, swelling, redness, odor or green/yellow discharge around incision site)    Complete by:  As directed    Call MD for:  severe uncontrolled pain    Complete by:  As directed    Call MD for:  temperature >100.4    Complete by:  As directed    Diet general    Complete by:  As directed    Discharge instructions    Complete by:  As directed    Empty measure and recharge JP drain daily   Discharge wound care:    Complete by:  As directed    Dry dressing around JP drain if drainage, also bandaid around lower drain site as needed   Driving Restrictions    Complete by:  As directed    No driving while on prescription pain medication   Increase activity slowly    Complete by:  As directed        Medication List    TAKE these medications   cephALEXin 500 MG capsule Commonly known as:  KEFLEX Take 1 capsule (500 mg total) by mouth 4 (four) times daily.   metroNIDAZOLE 500 MG tablet Commonly known as:  FLAGYL Take 1 tablet (500 mg total) by mouth 3 (three) times daily.      Follow-up Information    Gladis Riffleatherine L Salihah Peckham, MD Follow up on 12/04/2015.   Specialty:  Surgery Why:  Thursday at 10:00am for hospital follow-up Contact information: [address] [place] [address] [phone]           Signed: Gladis RiffleCatherine L Jamyiah Labella 11/27/2015, 9:40 AM

## 2015-11-27 NOTE — Progress Notes (Signed)
IV was removed. Discharge instructions, follow-up appointments, and prescriptions were provided to the pt via intrepreter. All questions answered. Pt is to take medications to pharmacy with coupon.  Pt is now waiting on ride.

## 2015-11-30 LAB — AEROBIC/ANAEROBIC CULTURE (SURGICAL/DEEP WOUND): CULTURE: NO GROWTH

## 2015-11-30 LAB — AEROBIC/ANAEROBIC CULTURE W GRAM STAIN (SURGICAL/DEEP WOUND)

## 2015-12-04 ENCOUNTER — Ambulatory Visit
Admission: RE | Admit: 2015-12-04 | Discharge: 2015-12-04 | Disposition: A | Discharge status: Home / Self Care | Payer: Self-pay | Source: Ambulatory Visit | Attending: Surgery | Admitting: Surgery

## 2015-12-04 ENCOUNTER — Ambulatory Visit (INDEPENDENT_AMBULATORY_CARE_PROVIDER_SITE_OTHER): Payer: Self-pay | Admitting: Surgery

## 2015-12-04 ENCOUNTER — Encounter: Payer: Self-pay | Admitting: Surgery

## 2015-12-04 VITALS — BP 115/73 | HR 69 | Temp 98.3°F | Wt 214.0 lb

## 2015-12-04 DIAGNOSIS — K750 Abscess of liver: Secondary | ICD-10-CM | POA: Insufficient documentation

## 2015-12-04 DIAGNOSIS — K3580 Unspecified acute appendicitis: Secondary | ICD-10-CM

## 2015-12-04 DIAGNOSIS — J90 Pleural effusion, not elsewhere classified: Secondary | ICD-10-CM | POA: Insufficient documentation

## 2015-12-04 MED ORDER — IOPAMIDOL (ISOVUE-300) INJECTION 61%
100.0000 mL | Freq: Once | INTRAVENOUS | Status: AC | PRN
  Administered 2015-12-04: 100 mL via INTRAVENOUS

## 2015-12-04 NOTE — Patient Instructions (Addendum)
Por favor regrese a Event organiserla clinica en la proxima semana para ver que todo [employer] Corporationeste bien.

## 2015-12-04 NOTE — Progress Notes (Signed)
40 year old who had a laparoscopic appendectomy done on 9/11 by Dr. Excell Seltzerooper subsequently had a liver abscess and was in the hospital last week to get higher place drain. I spoke with the patient through an interpreter. The patient states she's been doing well no longer having pain in that area. Patient has been taking his Keflex and Flagyl and should be done on Saturday. Patient has had no nausea or vomiting his appetite has been good and he's been having normal soft stools about twice a day but no diarrhea. The drainage output has been bloody in nature and about 20 mL per day.  He denies any fevers or chills.  Vitals:   12/04/15 1008  BP: 115/73  Pulse: 69  Temp: 98.3 F (36.8 C)   PE:  Gen: NAD Abd: soft, incisions well healed, JP drain with sangious drainage no tenderness  A/P:  Patient is overall doing well after having his liver abscess drained. It was a very large abscess about 6 cm in size and therefore will need to get a CT scan to determine if the area is completely drained. We will get that done today and have him return to the office to hopefully remove his drain if the CT scan looks good. Patient is in agreement with this plan will have him return after CT scan.

## 2015-12-10 ENCOUNTER — Encounter: Payer: Self-pay | Admitting: Surgery

## 2015-12-10 ENCOUNTER — Ambulatory Visit (INDEPENDENT_AMBULATORY_CARE_PROVIDER_SITE_OTHER): Payer: Self-pay | Admitting: Surgery

## 2015-12-10 VITALS — BP 123/85 | HR 65 | Temp 97.9°F | Wt 214.0 lb

## 2015-12-10 DIAGNOSIS — Z09 Encounter for follow-up examination after completed treatment for conditions other than malignant neoplasm: Secondary | ICD-10-CM

## 2015-12-10 NOTE — Progress Notes (Signed)
S/p lap appy by Dr. Excell Seltzerooper complicated by hepatic abscess s/p drain. Drain was removed Recent CT on 10/12 reviewed resolution of abscess More importantly he feels very well, afebrile, taking good PO  No complaints today  PE NAD Abd: soft, nt, no infection, no peritonitis  A/P doing well RTW in 10 days RTC prn

## 2015-12-10 NOTE — Patient Instructions (Signed)
Por favor llamenos si tiene preguntas. 

## 2018-02-10 IMAGING — CT CT ABD-PELV W/ CM
2 of 5 series · 15 of 46 positions shown, 17 images · IV contrast (APPLIED)
Comparison: None.

CLINICAL DATA: Diffuse abdominal pain.  Nausea and vomiting.

EXAM:
CT ABDOMEN AND PELVIS WITH CONTRAST
TECHNIQUE: Multidetector CT imaging of the abdomen and pelvis was performed
using the standard protocol following bolus administration of
intravenous contrast.
CONTRAST:  100 cc Isovue 370 IV.

[Series 2: axial st · axial · 0.77mm/px · z∈[-966,-536]mm · 12 of 98 slices shown, 14 images]
[im 6/98  soft-tissue]
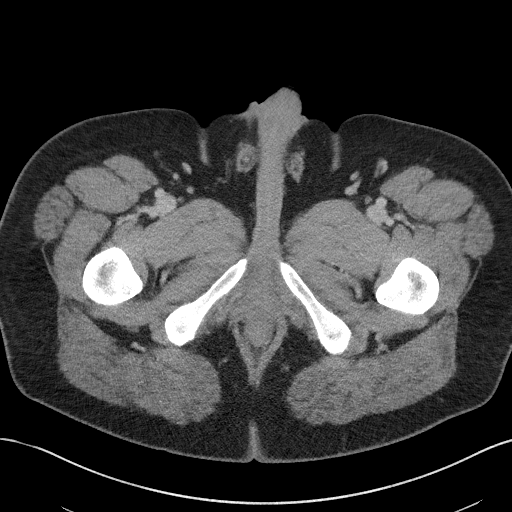
[im 6/98  bone]
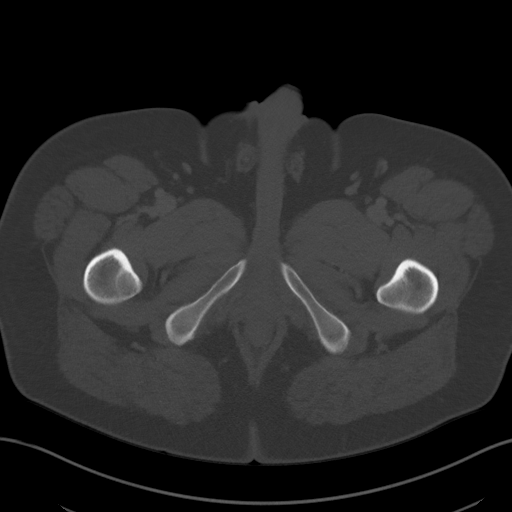
[im 17/98  soft-tissue]
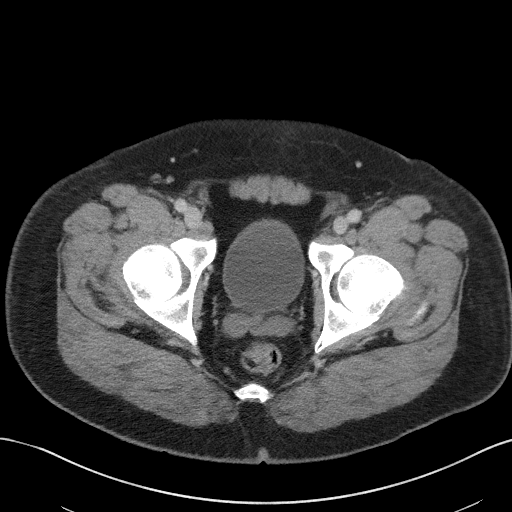
[im 22/98  soft-tissue]
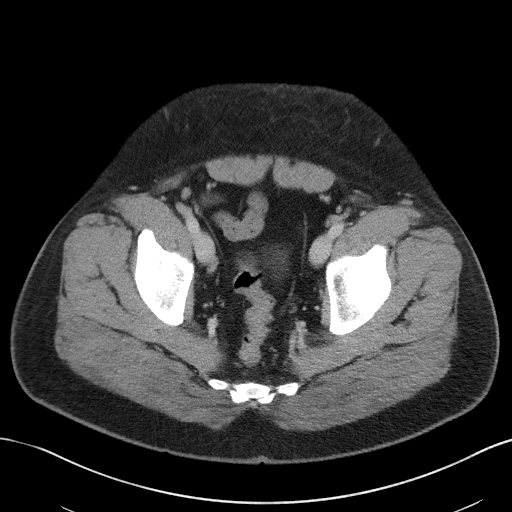
[im 27/98  soft-tissue]
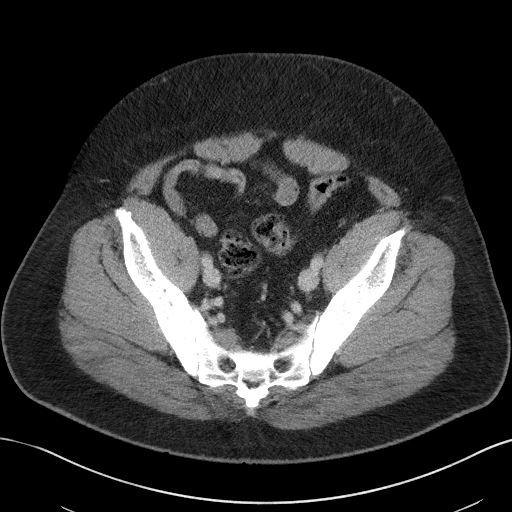
[im 38/98  soft-tissue]
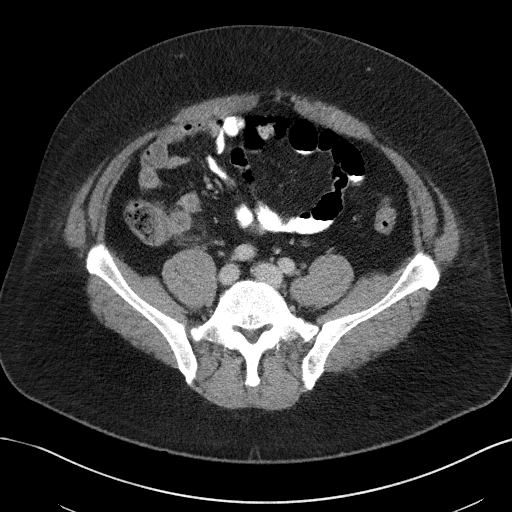
[im 44/98  soft-tissue]
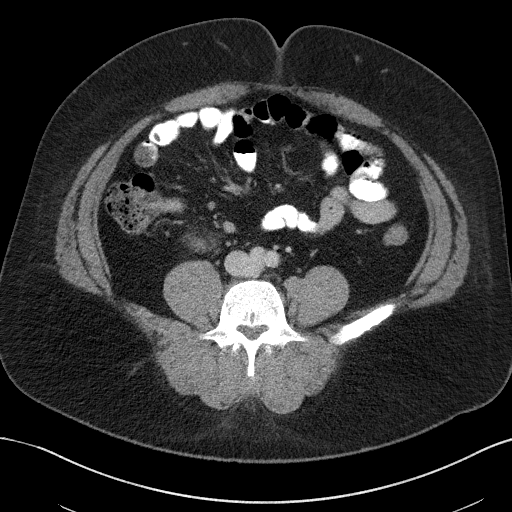
[im 54/98  soft-tissue]
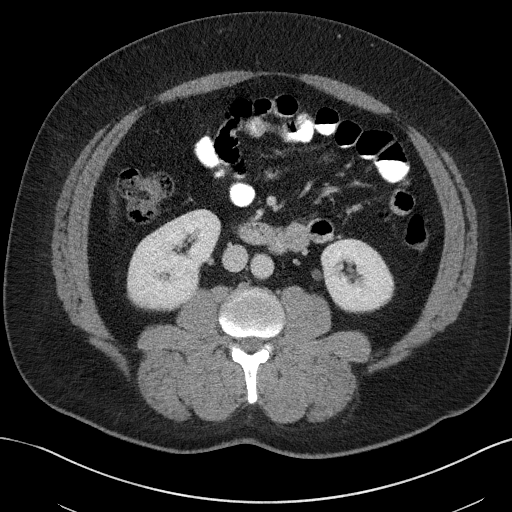
[im 60/98  soft-tissue]
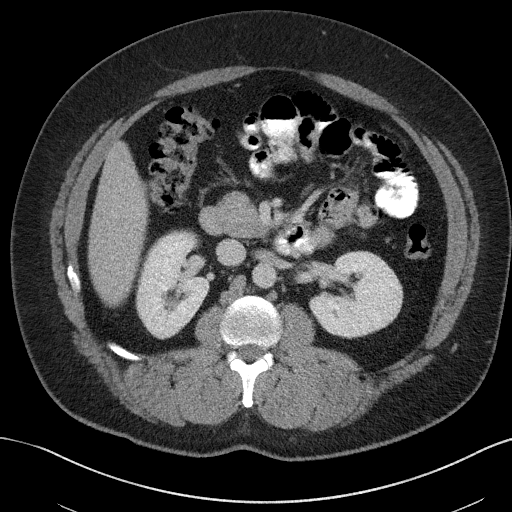
[im 71/98  soft-tissue]
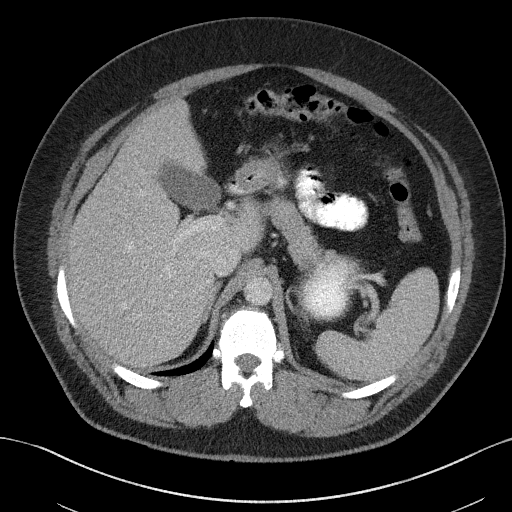
[im 71/98  bone]
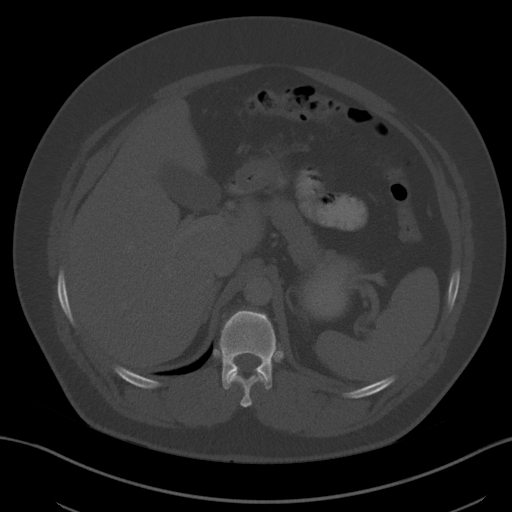
[im 76/98  soft-tissue]
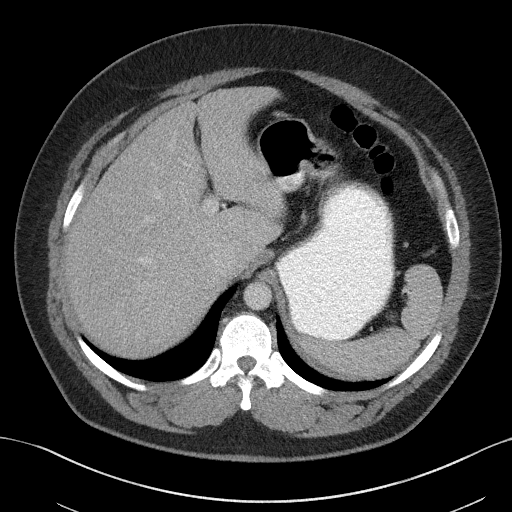
[im 81/98  soft-tissue]
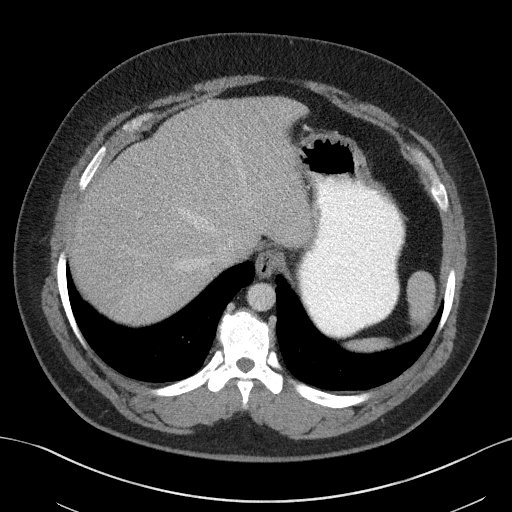
[im 92/98  soft-tissue]
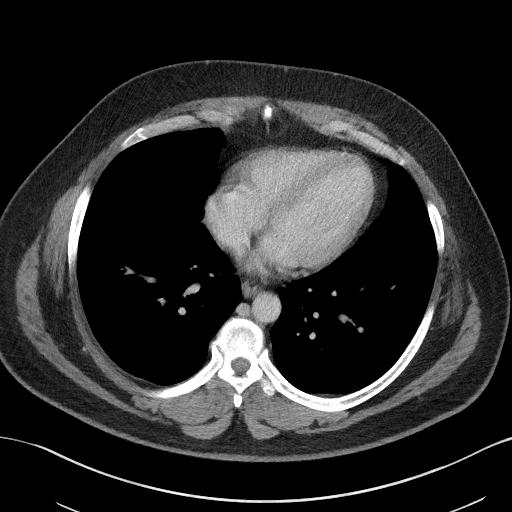

[Series 5: coronal st · coronal · 0.69mm/px · 3 of 105 slices shown]
[im 35/105  soft-tissue]
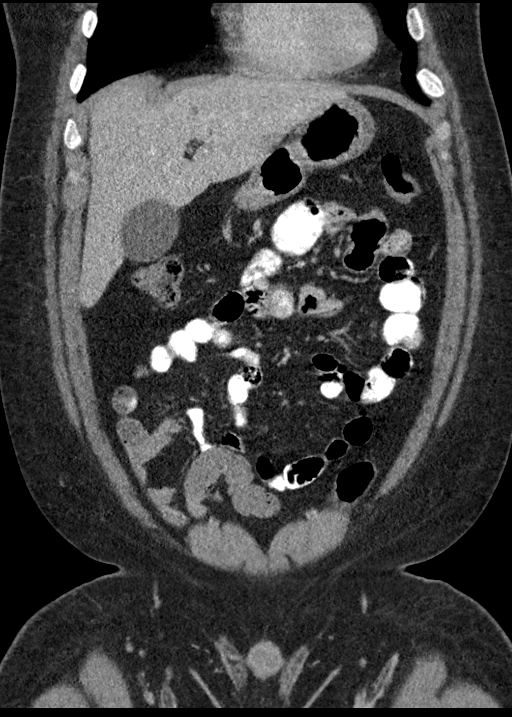
[im 47/105  soft-tissue]
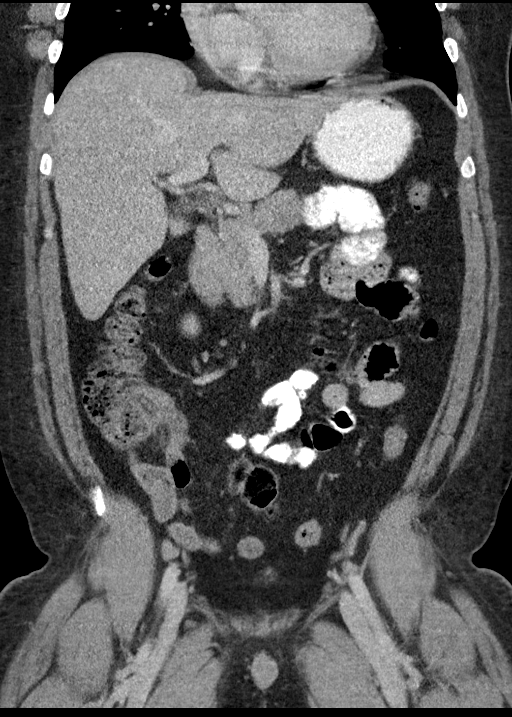
[im 58/105  soft-tissue]
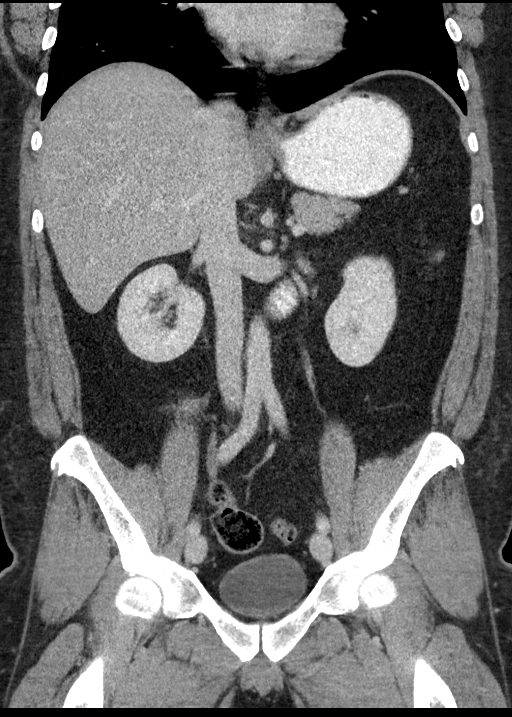

[15 of 46 positions shown; findings below may reference images not displayed]

FINDINGS: Lower chest: Subpleural 0.4 cm right lower lobe solid pulmonary
nodule (series 4/image 12).

Hepatobiliary: Normal liver size. Hypodense 0.6 cm left liver lobe
lesion, too small to characterize, for which no further follow-up is
required. No additional liver lesions. Normal gallbladder with no
radiopaque cholelithiasis. No biliary ductal dilatation.

Pancreas: Normal, with no mass or duct dilation.

Spleen: Normal size. No mass.

Adrenals/Urinary Tract: Normal adrenals. Normal kidneys with no
hydronephrosis and no renal mass. Normal bladder.

Stomach/Bowel: Grossly normal stomach. Normal caliber small bowel
with no small bowel wall thickening. Dilated appendix (17 mm
diameter). Diffuse appendiceal wall thickening and hyperenhancement
and periappendiceal fat stranding, consistent with acute
appendicitis. Normal large bowel with no diverticulosis, large bowel
wall thickening or pericolonic fat stranding.

Vascular/Lymphatic: Normal caliber abdominal aorta. Patent portal,
splenic, hepatic and renal veins. No pathologically enlarged lymph
nodes in the abdomen or pelvis.

Reproductive: Normal size prostate with nonspecific coarse internal
prostatic calcifications.

Other: No pneumoperitoneum, ascites or focal fluid collection.

Musculoskeletal: No aggressive appearing focal osseous lesions. Mild
thoracolumbar spondylosis .
IMPRESSION: Acute appendicitis.  No perforation or abscess.

These results were called by telephone at the time of interpretation
on 11/03/2015 at [DATE] to Dr. ADENIYI POCASANGRE , who verbally
acknowledged these results.
